# Patient Record
Sex: Female | Born: 1985 | Race: White | Hispanic: No | Marital: Single | Smoking: Former smoker
Health system: Southern US, Community
[De-identification: ages and names within clinical notes are randomized; demographics above are authoritative.]

## PROBLEM LIST (undated history)

## (undated) DIAGNOSIS — K760 Fatty (change of) liver, not elsewhere classified: Secondary | ICD-10-CM

## (undated) DIAGNOSIS — Z8489 Family history of other specified conditions: Secondary | ICD-10-CM

## (undated) DIAGNOSIS — H3090 Unspecified chorioretinal inflammation, unspecified eye: Secondary | ICD-10-CM

## (undated) DIAGNOSIS — Z87442 Personal history of urinary calculi: Secondary | ICD-10-CM

## (undated) HISTORY — DX: Fatty (change of) liver, not elsewhere classified: K76.0

## (undated) HISTORY — PX: WISDOM TOOTH EXTRACTION: SHX21

---
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
## 2007-06-26 ENCOUNTER — Emergency Department (HOSPITAL_COMMUNITY): Admission: EM | Admit: 2007-06-26 | Discharge: 2007-06-27 | Payer: Self-pay | Admitting: Emergency Medicine

## 2007-07-29 ENCOUNTER — Encounter: Admission: RE | Admit: 2007-07-29 | Discharge: 2007-07-29 | Payer: Self-pay | Admitting: Emergency Medicine

## 2011-04-28 LAB — COMPREHENSIVE METABOLIC PANEL
Albumin: 3 — ABNORMAL LOW
Alkaline Phosphatase: 84
BUN: 10
CO2: 25
Chloride: 97
Creatinine, Ser: 0.83
GFR calc non Af Amer: >60
Glucose, Bld: 109 — ABNORMAL HIGH
Potassium: 3.4 — ABNORMAL LOW
Total Bilirubin: 1.1

## 2011-04-28 LAB — CBC
HCT: 40.8
Hemoglobin: 14
MCV: 83.6
WBC: 5.2

## 2011-04-28 LAB — URINALYSIS, ROUTINE W REFLEX MICROSCOPIC
Glucose, UA: NEGATIVE
Ketones, ur: 15 — AB
Nitrite: NEGATIVE
Protein, ur: 30 — AB
Specific Gravity, Urine: 1.018
Urobilinogen, UA: 1
pH: 6

## 2011-04-28 LAB — URINE MICROSCOPIC-ADD ON

## 2011-04-28 LAB — DIFFERENTIAL
Basophils Absolute: 0
Basophils Relative: 0
Monocytes Absolute: 0.6
Neutro Abs: 3.3
Neutrophils Relative %: 63

## 2011-04-28 LAB — PREGNANCY, URINE: Preg Test, Ur: NEGATIVE

## 2011-04-28 LAB — LIPASE, BLOOD: Lipase: 19

## 2015-08-24 ENCOUNTER — Ambulatory Visit (INDEPENDENT_AMBULATORY_CARE_PROVIDER_SITE_OTHER): Payer: 59 | Admitting: Physician Assistant

## 2015-08-24 VITALS — BP 120/80 | HR 75 | Temp 98.3°F | Resp 20 | Ht 64.0 in | Wt 166.4 lb

## 2015-08-24 DIAGNOSIS — J069 Acute upper respiratory infection, unspecified: Secondary | ICD-10-CM | POA: Diagnosis not present

## 2015-08-24 DIAGNOSIS — B9789 Other viral agents as the cause of diseases classified elsewhere: Principal | ICD-10-CM

## 2015-08-24 MED ORDER — GUAIFENESIN ER 1200 MG PO TB12: 1.0000 | ORAL_TABLET | Freq: Two times a day (BID) | ORAL | Status: AC

## 2015-08-24 MED ORDER — HYDROCOD POLST-CPM POLST ER 10-8 MG/5ML PO SUER: 5.0000 mL | Freq: Two times a day (BID) | ORAL | Status: DC | PRN

## 2015-08-24 NOTE — Patient Instructions (Signed)
Please push fluids.  Tylenol and Motrin for fever and body aches.    A humidifier can help especially when the air is dry -if you do not have a humidifier you can boil a pot of water on the stove in your home to help with the dry air.  

## 2015-08-24 NOTE — Progress Notes (Signed)
   Felicia Torres  MRN: [id number] DOB: 1985-10-24  Subjective:  Pt presents to clinic with 3 days h/o cold symptoms that started with subjective fever and myalgias. yesterday she developed sinus congestion but no rhinorrhea.  She has a cough that is disrupting her sleep at night.  Home treatment - tylenol Sick contacts - at work No Flu vaccine  There are no active problems to display for this patient.   No current outpatient prescriptions on file prior to visit.   No current facility-administered medications on file prior to visit.    No Known Allergies  Review of Systems  Constitutional: Positive for fever (subjective) and chills.  HENT: Positive for congestion and postnasal drip. Negative for rhinorrhea and sore throat.   Respiratory: Positive for cough.   Musculoskeletal: Positive for myalgias.  Neurological: Negative for headaches.   Objective:  BP 120/80 mmHg  Pulse 75  Temp(Src) 98.3 F (36.8 C) (Oral)  Resp 20  Ht  (1.626 m)  Wt 166 lb 6.4 oz (75.479 kg)  BMI 28.55 kg/m2  SpO2 98%  LMP 08/12/2015  Physical Exam  Constitutional: She is oriented to person, place, and time and well-developed, well-nourished, and in no distress.  HENT:  Head: Normocephalic and atraumatic.  Right Ear: Hearing, tympanic membrane, external ear and ear canal normal.  Left Ear: Hearing, tympanic membrane, external ear and ear canal normal.  Nose: Mucosal edema (red) present.  Mouth/Throat: Uvula is midline, oropharynx is clear and moist and mucous membranes are normal.  Eyes: Conjunctivae are normal.  Neck: Normal range of motion.  Cardiovascular: Normal rate, regular rhythm and normal heart sounds.   No murmur heard. Pulmonary/Chest: Effort normal and breath sounds normal.  Neurological: She is alert and oriented to person, place, and time. Gait normal.  Skin: Skin is warm and dry.  Psychiatric: Mood, memory, affect and judgment normal.  Vitals reviewed.   Assessment  and Plan :  Viral URI with cough - Plan: Guaifenesin (MUCINEX MAXIMUM STRENGTH) 1200 MG TB12, chlorpheniramine-HYDROcodone (TUSSIONEX PENNKINETIC ER) 10-8 MG/5ML SUER, Care order/instruction  Symptomatic care d/w pt.  Benny Lennert PA-C  [facility] 08/24/2015 9:31 AM

## 2017-10-13 ENCOUNTER — Emergency Department (HOSPITAL_COMMUNITY)
Admission: EM | Admit: 2017-10-13 | Discharge: 2017-10-13 | Disposition: A | Discharge status: Home / Self Care | Payer: Managed Care, Other (non HMO) | Attending: Emergency Medicine | Admitting: Emergency Medicine

## 2017-10-13 ENCOUNTER — Other Ambulatory Visit: Payer: Self-pay

## 2017-10-13 ENCOUNTER — Encounter (HOSPITAL_COMMUNITY): Payer: Self-pay | Admitting: Nurse Practitioner

## 2017-10-13 ENCOUNTER — Emergency Department (HOSPITAL_COMMUNITY): Payer: Managed Care, Other (non HMO)

## 2017-10-13 DIAGNOSIS — N130 Hydronephrosis with ureteropelvic junction obstruction: Secondary | ICD-10-CM | POA: Diagnosis not present

## 2017-10-13 DIAGNOSIS — F17210 Nicotine dependence, cigarettes, uncomplicated: Secondary | ICD-10-CM | POA: Insufficient documentation

## 2017-10-13 DIAGNOSIS — N201 Calculus of ureter: Secondary | ICD-10-CM | POA: Diagnosis not present

## 2017-10-13 DIAGNOSIS — R112 Nausea with vomiting, unspecified: Secondary | ICD-10-CM | POA: Diagnosis not present

## 2017-10-13 DIAGNOSIS — R319 Hematuria, unspecified: Secondary | ICD-10-CM | POA: Diagnosis not present

## 2017-10-13 DIAGNOSIS — R1031 Right lower quadrant pain: Secondary | ICD-10-CM | POA: Diagnosis present

## 2017-10-13 LAB — URINALYSIS, ROUTINE W REFLEX MICROSCOPIC
Bilirubin Urine: NEGATIVE
Glucose, UA: NEGATIVE mg/dL
KETONES UR: 80 mg/dL — AB
Leukocytes, UA: NEGATIVE
Nitrite: NEGATIVE
PROTEIN: 30 mg/dL — AB
Specific Gravity, Urine: 1.029 (ref 1.005–1.030)
pH: 6 (ref 5.0–8.0)

## 2017-10-13 LAB — COMPREHENSIVE METABOLIC PANEL
ALBUMIN: 4 g/dL (ref 3.5–5.0)
ALT: 21 U/L (ref 14–54)
AST: 21 U/L (ref 15–41)
Alkaline Phosphatase: 46 U/L (ref 38–126)
Anion gap: 8 (ref 5–15)
BUN: 14 mg/dL (ref 6–20)
CHLORIDE: 106 mmol/L (ref 101–111)
CO2: 24 mmol/L (ref 22–32)
CREATININE: 0.94 mg/dL (ref 0.44–1.00)
Calcium: 9.1 mg/dL (ref 8.9–10.3)
GFR calc non Af Amer: >60 mL/min (ref >60)
GLUCOSE: 112 mg/dL — AB (ref 65–99)
Potassium: 4.2 mmol/L (ref 3.5–5.1)
SODIUM: 138 mmol/L (ref 135–145)
Total Bilirubin: 0.8 mg/dL (ref 0.3–1.2)
Total Protein: 7.5 g/dL (ref 6.5–8.1)

## 2017-10-13 LAB — CBC
HCT: 41.4 % (ref 36.0–46.0)
Hemoglobin: 13.9 g/dL (ref 12.0–15.0)
MCH: 29.6 pg (ref 26.0–34.0)
MCHC: 33.6 g/dL (ref 30.0–36.0)
MCV: 88.3 fL (ref 78.0–100.0)
PLATELETS: 279 10*3/uL (ref 150–400)
RBC: 4.69 MIL/uL (ref 3.87–5.11)
RDW: 13.3 % (ref 11.5–15.5)
WBC: 13.1 10*3/uL — ABNORMAL HIGH (ref 4.0–10.5)

## 2017-10-13 LAB — I-STAT BETA HCG BLOOD, ED (MC, WL, AP ONLY): I-stat hCG, quantitative: <5 m[IU]/mL (ref <5)

## 2017-10-13 LAB — LIPASE, BLOOD: LIPASE: 28 U/L (ref 11–51)

## 2017-10-13 MED ORDER — HYDROCODONE-ACETAMINOPHEN 5-325 MG PO TABS
2.0000 | ORAL_TABLET | Freq: Once | ORAL | Status: AC
  Administered 2017-10-13: 2 via ORAL
  Filled 2017-10-13: qty 2

## 2017-10-13 MED ORDER — SODIUM CHLORIDE 0.9 % IV BOLUS
1000.0000 mL | Freq: Once | INTRAVENOUS | Status: AC
  Administered 2017-10-13: 1000 mL via INTRAVENOUS

## 2017-10-13 MED ORDER — ONDANSETRON 4 MG PO TBDP: 4.0000 mg | ORAL_TABLET | Freq: Three times a day (TID) | ORAL | 0 refills | Status: DC | PRN

## 2017-10-13 MED ORDER — ONDANSETRON HCL 4 MG/2ML IJ SOLN
4.0000 mg | Freq: Once | INTRAMUSCULAR | Status: AC
  Administered 2017-10-13: 4 mg via INTRAVENOUS
  Filled 2017-10-13: qty 2

## 2017-10-13 MED ORDER — KETOROLAC TROMETHAMINE 30 MG/ML IJ SOLN
30.0000 mg | Freq: Once | INTRAMUSCULAR | Status: AC
  Administered 2017-10-13: 30 mg via INTRAVENOUS
  Filled 2017-10-13: qty 1

## 2017-10-13 MED ORDER — HYDROCODONE-ACETAMINOPHEN 5-325 MG PO TABS: 1.0000 | ORAL_TABLET | ORAL | 0 refills | Status: DC | PRN

## 2017-10-13 NOTE — Discharge Instructions (Signed)
°  Kidney Stone There is evidence of a kidney stone on the right side.  It appears as though it is on its way out.  Some kidney stones can take up to 30 days to pass. Hydration: Hydration is key to helping a kidney stone pass.  Have a goal of half a liter of water every hour or two. Antiinflammatory medications: Take 600 mg of ibuprofen every 6 hours or 440 mg (over the counter dose) to 500 mg (prescription dose) of naproxen every 12 hours for the next 3 days. After this time, these medications may be used as needed for pain. Take these medications with food to avoid upset stomach. Choose only one of these medications, do not take them together. Tylenol: Should you continue to have additional pain while taking the ibuprofen or naproxen, you may add in tylenol as needed. Your daily total maximum amount of tylenol from all sources should be limited to 4000mg /day for persons without liver problems, or 2000mg /day for those with liver problems. Vicodin: May take Vicodin as needed for severe pain.  Do not drive or perform other dangerous activities while taking the Vicodin.  Please note that each pill of Vicodin contains 325 mg of Tylenol and the above dosage limits apply. Zofran: Use of Zofran, as needed, for nausea/vomiting. Follow-up: Follow-up with the urologist as soon as possible on this matter.

## 2017-10-13 NOTE — ED Triage Notes (Signed)
Patient came to the ER for back pain on the right side and blood in her urine. Onset started last night. Vomited one time this AM.

## 2017-10-13 NOTE — ED Provider Notes (Addendum)
[facility]-EMERGENCY DEPT Provider Note   CSN: [id number] Arrival date & time: 10/13/17  [id number]     History   Chief Complaint No chief complaint on file.   HPI Felicia Torres is a 32 y.o. female.  HPI   Felicia Torres is a 32 y.o. female, patient with no pertinent past medical history, presenting to the ED with right lower back pain beginning yesterday. Pain is cramping, ranging from 7/10 to 10/10, radiating to right flank. Accompanied by hematuria this morning.  Accompanied by nausea and vomiting, vomiting with spikes in pain.  LMP 3/7. Has tried ibuprofen, which helped last night, but not today. Denies fever, dysuria, abnormal vaginal discharge/bleeding, diarrhea, abdominal pain, or any other complaints.      History reviewed. No pertinent past medical history.  There are no active problems to display for this patient.   History reviewed. No pertinent surgical history.   OB History   None      Home Medications    Prior to Admission medications   Medication Sig Start Date End Date Taking? Authorizing Provider  ibuprofen (ADVIL,MOTRIN) 200 MG tablet Take 600 mg by mouth daily as needed (PAIN).   Yes [provider]  chlorpheniramine-HYDROcodone (TUSSIONEX PENNKINETIC ER) 10-8 MG/5ML SUER Take 5 mLs by mouth 2 (two) times daily as needed for cough. Patient not taking: Reported on 10/13/2017 08/24/15   Morrell RiddleWeber, Sarah L, PA-C  HYDROcodone-acetaminophen (NORCO/VICODIN) 5-325 MG tablet Take 1-2 tablets by mouth every 4 (four) hours as needed for severe pain. 10/13/17   Joy, Shawn C, PA-C  ondansetron (ZOFRAN ODT) 4 MG disintegrating tablet Take 1 tablet (4 mg total) by mouth every 8 (eight) hours as needed for nausea or vomiting. 10/13/17   Joy, Hillard DankerShawn C, PA-C    Family History Family History  Problem Relation Age of Onset  . Cancer Father     Social History Social History   Tobacco Use  . Smoking status: Current Every Day Smoker   Packs/day: 1.00    Years: 10.00    Pack years: 10.00    Types: Cigarettes  . Smokeless tobacco: Never Used  Substance Use Topics  . Alcohol use: Yes    Alcohol/week: 0.6 oz    Types: 1 Standard drinks or equivalent per week  . Drug use: No     Allergies   Patient has no known allergies.   Review of Systems Review of Systems  Constitutional: Negative for chills and fever.  Respiratory: Negative for cough and shortness of breath.   Gastrointestinal: Positive for nausea and vomiting. Negative for abdominal pain, blood in stool and diarrhea.  Genitourinary: Positive for flank pain and hematuria. Negative for vaginal bleeding and vaginal discharge.  Musculoskeletal: Positive for back pain.  All other systems reviewed and are negative.    Physical Exam Updated Vital Signs BP 126/88   Pulse 76   Temp 97.6 F (36.4 C)   Resp 18   Ht 5\' 4"  (1.626 m)   Wt 80.3 kg (177 lb)   SpO2 94%   BMI 30.38 kg/m   Physical Exam  Constitutional: She appears well-developed and well-nourished. No distress.  HENT:  Head: Normocephalic and atraumatic.  Eyes: Conjunctivae are normal.  Neck: Neck supple.  Cardiovascular: Normal rate, regular rhythm, normal heart sounds and intact distal pulses.  Pulmonary/Chest: Effort normal and breath sounds normal. No respiratory distress.  Abdominal: Soft. There is no tenderness. There is no guarding and no CVA tenderness.  Musculoskeletal: She  exhibits no edema.  Lymphadenopathy:    She has no cervical adenopathy.  Neurological: She is alert.  Skin: Skin is warm and dry. She is not diaphoretic.  Psychiatric: She has a normal mood and affect. Her behavior is normal.  Nursing note and vitals reviewed.    ED Treatments / Results  Labs (all labs ordered are listed, but only abnormal results are displayed) Labs Reviewed  COMPREHENSIVE METABOLIC PANEL - Abnormal; Notable for the following components:      Result Value   Glucose, Bld 112 (*)    All  other components within normal limits  CBC - Abnormal; Notable for the following components:   WBC 13.1 (*)    All other components within normal limits  URINALYSIS, ROUTINE W REFLEX MICROSCOPIC - Abnormal; Notable for the following components:   Color, Urine AMBER (*)    APPearance HAZY (*)    Hgb urine dipstick MODERATE (*)    Ketones, ur 80 (*)    Protein, ur 30 (*)    Bacteria, UA MANY (*)    Squamous Epithelial / LPF TOO NUMEROUS TO COUNT (*)    All other components within normal limits  LIPASE, BLOOD  I-STAT BETA HCG BLOOD, ED ([facility], [facility], [facility] ONLY)    EKG None  Radiology Ct Renal Stone Study  Result Date: 10/13/2017 CLINICAL DATA:  Right-sided flank pain, vomiting, and hematuria. EXAM: CT ABDOMEN AND PELVIS WITHOUT CONTRAST TECHNIQUE: Multidetector CT imaging of the abdomen and pelvis was performed following the standard protocol without IV contrast. COMPARISON:  None. FINDINGS: Lower chest: No acute findings. Hepatobiliary: No mass visualized on this unenhanced exam. Gallbladder is unremarkable. Pancreas: No mass or inflammatory process visualized on this unenhanced exam. Spleen:  Within normal limits in size. Adrenals/Urinary tract: Mild right hydronephrosis is seen due to a 4 mm calculus at the right ureteropelvic junction. A 2 mm calculus is seen in the midpole the left kidney, however there is no evidence of left-sided ureteral calculi or hydronephrosis. Stomach/Bowel: No evidence of obstruction, inflammatory process, or abnormal fluid collections. Normal appendix visualized. Vascular/Lymphatic: No pathologically enlarged lymph nodes identified. No evidence of abdominal aortic aneurysm. Reproductive:  No mass or other significant abnormality. Other:  None. Musculoskeletal:  No suspicious bone lesions identified. IMPRESSION: Mild right hydronephrosis due to 4 mm calculus at the right ureteropelvic junction. Tiny nonobstructing left renal calculus. Electronically Signed   By: Myles Rosenthal  M.D.   On: 10/13/2017 16:19    Procedures Procedures (including critical care time)  Medications Ordered in ED Medications  ondansetron (ZOFRAN) injection 4 mg (4 mg Intravenous Given 10/13/17 1556)  sodium chloride 0.9 % bolus 1,000 mL (0 mLs Intravenous Stopped 10/13/17 1732)  ketorolac (TORADOL) 30 MG/ML injection 30 mg (30 mg Intravenous Given 10/13/17 1556)  HYDROcodone-acetaminophen (NORCO/VICODIN) 5-325 MG per tablet 2 tablet (2 tablets Oral Given 10/13/17 1734)     Initial Impression / Assessment and Plan / ED Course  I have reviewed the triage vital signs and the nursing notes.  Pertinent labs & imaging results that were available during my care of the patient were reviewed by me and considered in my medical decision making (see chart for details).     Patient presents with right lower back and flank pain.  Pain pattern consistent with renal colic.  Ureteral stone at UPJ noted on the right.  Mild leukocytosis present.  Hematuria noted on UA, consistent with stone disease.  Lab results otherwise unremarkable.  Urology follow-up. The patient was  given instructions for home care as well as return precautions. Patient voices understanding of these instructions, accepts the plan, and is comfortable with discharge.    Vitals:   10/13/17 0934 10/13/17 0936 10/13/17 1521 10/13/17 1733  BP: 126/88  (!) 122/92 114/73  Pulse: 76  (!) 59 77  Resp: 18  20 18   Temp: 97.6 F (36.4 C)  97.7 F (36.5 C)   TempSrc:   Oral   SpO2: 94%  98% 100%  Weight:  80.3 kg (177 lb)    Height:  5\' 4"  (1.626 m)       Final Clinical Impressions(s) / ED Diagnoses   Final diagnoses:  Right ureteral stone    ED Discharge Orders        Ordered    HYDROcodone-acetaminophen (NORCO/VICODIN) 5-325 MG tablet  Every 4 hours PRN     10/13/17 1635    ondansetron (ZOFRAN ODT) 4 MG disintegrating tablet  Every 8 hours PRN     10/13/17 1635       Anselm Pancoast, PA-C 10/13/17 1639    Anselm Pancoast,  PA-C 10/13/17 1913    Loren Racer, MD 10/20/17 [phone]

## 2017-10-15 ENCOUNTER — Ambulatory Visit (HOSPITAL_BASED_OUTPATIENT_CLINIC_OR_DEPARTMENT_OTHER): Payer: Managed Care, Other (non HMO) | Admitting: Anesthesiology

## 2017-10-15 ENCOUNTER — Encounter (HOSPITAL_BASED_OUTPATIENT_CLINIC_OR_DEPARTMENT_OTHER)
Admission: RE | Disposition: A | Discharge status: Home / Self Care | Payer: Self-pay | Source: Ambulatory Visit | Attending: Urology

## 2017-10-15 ENCOUNTER — Encounter (HOSPITAL_BASED_OUTPATIENT_CLINIC_OR_DEPARTMENT_OTHER): Payer: Self-pay

## 2017-10-15 ENCOUNTER — Ambulatory Visit (HOSPITAL_BASED_OUTPATIENT_CLINIC_OR_DEPARTMENT_OTHER)
Admission: RE | Admit: 2017-10-15 | Discharge: 2017-10-15 | Disposition: A | Discharge status: Home / Self Care | Payer: Managed Care, Other (non HMO) | Source: Ambulatory Visit | Attending: Urology | Admitting: Urology

## 2017-10-15 ENCOUNTER — Other Ambulatory Visit: Payer: Self-pay | Admitting: Urology

## 2017-10-15 ENCOUNTER — Other Ambulatory Visit: Payer: Self-pay

## 2017-10-15 DIAGNOSIS — N201 Calculus of ureter: Secondary | ICD-10-CM | POA: Diagnosis not present

## 2017-10-15 DIAGNOSIS — R509 Fever, unspecified: Secondary | ICD-10-CM | POA: Insufficient documentation

## 2017-10-15 HISTORY — PX: URETEROSCOPY: SHX842

## 2017-10-15 HISTORY — DX: Family history of other specified conditions: Z84.89

## 2017-10-15 HISTORY — DX: Personal history of urinary calculi: Z87.442

## 2017-10-15 HISTORY — DX: Unspecified chorioretinal inflammation, unspecified eye: H30.90

## 2017-10-15 LAB — POCT PREGNANCY, URINE: Preg Test, Ur: NEGATIVE

## 2017-10-15 SURGERY — URETEROSCOPY
Anesthesia: General | Laterality: Right

## 2017-10-15 MED ORDER — FENTANYL CITRATE (PF) 100 MCG/2ML IJ SOLN
INTRAMUSCULAR | Status: DC | PRN
  Administered 2017-10-15: 50 ug via INTRAVENOUS

## 2017-10-15 MED ORDER — FENTANYL CITRATE (PF) 100 MCG/2ML IJ SOLN
25.0000 ug | INTRAMUSCULAR | Status: DC | PRN
  Filled 2017-10-15: qty 1

## 2017-10-15 MED ORDER — CIPROFLOXACIN HCL 500 MG PO TABS: 500.0000 mg | ORAL_TABLET | Freq: Two times a day (BID) | ORAL | 0 refills | Status: DC

## 2017-10-15 MED ORDER — LIDOCAINE 2% (20 MG/ML) 5 ML SYRINGE
INTRAMUSCULAR | Status: DC | PRN
  Administered 2017-10-15: 100 mg via INTRAVENOUS

## 2017-10-15 MED ORDER — MIDAZOLAM HCL 2 MG/2ML IJ SOLN
INTRAMUSCULAR | Status: AC
  Filled 2017-10-15: qty 2

## 2017-10-15 MED ORDER — IOHEXOL 300 MG/ML  SOLN
INTRAMUSCULAR | Status: DC | PRN
Start: 2017-10-15 — End: 2017-10-15
  Administered 2017-10-15: 4 mL

## 2017-10-15 MED ORDER — CIPROFLOXACIN IN D5W 400 MG/200ML IV SOLN
INTRAVENOUS | Status: AC
  Filled 2017-10-15: qty 200

## 2017-10-15 MED ORDER — FENTANYL CITRATE (PF) 100 MCG/2ML IJ SOLN
INTRAMUSCULAR | Status: AC
  Filled 2017-10-15: qty 2

## 2017-10-15 MED ORDER — PHENAZOPYRIDINE HCL 200 MG PO TABS: 200.0000 mg | ORAL_TABLET | Freq: Three times a day (TID) | ORAL | 0 refills | Status: DC | PRN

## 2017-10-15 MED ORDER — MEPERIDINE HCL 25 MG/ML IJ SOLN
6.2500 mg | INTRAMUSCULAR | Status: DC | PRN
  Filled 2017-10-15: qty 1

## 2017-10-15 MED ORDER — ONDANSETRON HCL 4 MG/2ML IJ SOLN
INTRAMUSCULAR | Status: DC | PRN
  Administered 2017-10-15: 4 mg via INTRAVENOUS

## 2017-10-15 MED ORDER — CIPROFLOXACIN IN D5W 400 MG/200ML IV SOLN
400.0000 mg | INTRAVENOUS | Status: AC
  Administered 2017-10-15: 400 mg via INTRAVENOUS
  Filled 2017-10-15: qty 200

## 2017-10-15 MED ORDER — BELLADONNA ALKALOIDS-OPIUM 16.2-60 MG RE SUPP
RECTAL | Status: AC
  Filled 2017-10-15: qty 1

## 2017-10-15 MED ORDER — LACTATED RINGERS IV SOLN
INTRAVENOUS | Status: DC
  Administered 2017-10-15: 12:00:00 via INTRAVENOUS
  Filled 2017-10-15: qty 1000

## 2017-10-15 MED ORDER — DEXAMETHASONE SODIUM PHOSPHATE 4 MG/ML IJ SOLN
INTRAMUSCULAR | Status: DC | PRN
  Administered 2017-10-15: 4 mg via INTRAVENOUS

## 2017-10-15 MED ORDER — SODIUM CHLORIDE 0.9 % IR SOLN
Status: DC | PRN
  Administered 2017-10-15: 3000 mL

## 2017-10-15 MED ORDER — METOCLOPRAMIDE HCL 5 MG/ML IJ SOLN
INTRAMUSCULAR | Status: AC
Start: 2017-10-15 — End: 2017-10-15
  Filled 2017-10-15: qty 2

## 2017-10-15 MED ORDER — BELLADONNA ALKALOIDS-OPIUM 16.2-60 MG RE SUPP
RECTAL | Status: DC | PRN
  Administered 2017-10-15: 1 via RECTAL

## 2017-10-15 MED ORDER — ACETAMINOPHEN 160 MG/5ML PO SOLN
325.0000 mg | ORAL | Status: DC | PRN
  Filled 2017-10-15: qty 20.3

## 2017-10-15 MED ORDER — METOCLOPRAMIDE HCL 5 MG/ML IJ SOLN
INTRAMUSCULAR | Status: DC | PRN
  Administered 2017-10-15: 10 mg via INTRAVENOUS

## 2017-10-15 MED ORDER — ONDANSETRON HCL 4 MG/2ML IJ SOLN
4.0000 mg | Freq: Once | INTRAMUSCULAR | Status: DC | PRN
  Filled 2017-10-15: qty 2

## 2017-10-15 MED ORDER — ACETAMINOPHEN 325 MG PO TABS
325.0000 mg | ORAL_TABLET | ORAL | Status: DC | PRN
  Filled 2017-10-15: qty 2

## 2017-10-15 MED ORDER — LIDOCAINE 2% (20 MG/ML) 5 ML SYRINGE
INTRAMUSCULAR | Status: AC
  Filled 2017-10-15: qty 5

## 2017-10-15 MED ORDER — DEXAMETHASONE SODIUM PHOSPHATE 10 MG/ML IJ SOLN
INTRAMUSCULAR | Status: AC
  Filled 2017-10-15: qty 1

## 2017-10-15 MED ORDER — OXYCODONE HCL 5 MG/5ML PO SOLN
5.0000 mg | Freq: Once | ORAL | Status: DC | PRN
  Filled 2017-10-15: qty 5

## 2017-10-15 MED ORDER — MIDAZOLAM HCL 5 MG/5ML IJ SOLN
INTRAMUSCULAR | Status: DC | PRN
  Administered 2017-10-15: 2 mg via INTRAVENOUS

## 2017-10-15 MED ORDER — PROPOFOL 10 MG/ML IV BOLUS
INTRAVENOUS | Status: DC | PRN
  Administered 2017-10-15: 200 mg via INTRAVENOUS

## 2017-10-15 MED ORDER — CIPROFLOXACIN HCL 500 MG PO TABS: 500.0000 mg | ORAL_TABLET | Freq: Once | ORAL | 0 refills | Status: DC

## 2017-10-15 MED ORDER — OXYCODONE HCL 5 MG PO TABS
5.0000 mg | ORAL_TABLET | Freq: Once | ORAL | Status: DC | PRN
  Filled 2017-10-15: qty 1

## 2017-10-15 MED ORDER — ONDANSETRON HCL 4 MG/2ML IJ SOLN
INTRAMUSCULAR | Status: AC
  Filled 2017-10-15: qty 2

## 2017-10-15 MED ORDER — PROPOFOL 10 MG/ML IV BOLUS
INTRAVENOUS | Status: AC
  Filled 2017-10-15: qty 20

## 2017-10-15 SURGICAL SUPPLY — 32 items
BAG DRAIN URO-CYSTO SKYTR STRL (DRAIN) ×3 IMPLANT
BASKET LASER NITINOL 1.9FR (BASKET) IMPLANT
BASKET STNLS GEMINI 4WIRE 3FR (BASKET) IMPLANT
BASKET ZERO TIP NITINOL 2.4FR (BASKET) IMPLANT
CATH URET 5FR 28IN OPEN ENDED (CATHETERS) ×3 IMPLANT
CATH URET DUAL LUMEN 6-10FR 50 (CATHETERS) IMPLANT
CLOTH BEACON ORANGE TIMEOUT ST (SAFETY) ×3 IMPLANT
EXTRACTOR STONE 1.7FRX115CM (UROLOGICAL SUPPLIES) IMPLANT
FIBER LASER TRAC TIP (UROLOGICAL SUPPLIES) IMPLANT
GLOVE BIO SURGEON STRL SZ7.5 (GLOVE) ×3 IMPLANT
GLOVE BIOGEL PI IND STRL 7.5 (GLOVE) ×2 IMPLANT
GLOVE BIOGEL PI INDICATOR 7.5 (GLOVE) ×4
GLOVE SURG SS PI 7.0 STRL IVOR (GLOVE) ×3 IMPLANT
GOWN STRL REUS W/TWL LRG LVL3 (GOWN DISPOSABLE) ×3 IMPLANT
GOWN STRL REUS W/TWL XL LVL3 (GOWN DISPOSABLE) ×9 IMPLANT
GUIDEWIRE 0.038 PTFE COATED (WIRE) IMPLANT
GUIDEWIRE ANG ZIPWIRE 038X150 (WIRE) IMPLANT
GUIDEWIRE STR DUAL SENSOR (WIRE) ×3 IMPLANT
INFUSOR MANOMETER BAG 3000ML (MISCELLANEOUS) ×3 IMPLANT
IV NS IRRIG 3000ML ARTHROMATIC (IV SOLUTION) ×3 IMPLANT
KIT BALLIN UROMAX 15FX10 (LABEL) IMPLANT
KIT BALLN UROMAX 15FX4 (MISCELLANEOUS) IMPLANT
KIT BALLN UROMAX 26 75X4 (MISCELLANEOUS)
KIT TURNOVER CYSTO (KITS) ×3 IMPLANT
MANIFOLD NEPTUNE II (INSTRUMENTS) ×3 IMPLANT
NS IRRIG 500ML POUR BTL (IV SOLUTION) ×3 IMPLANT
PACK CYSTO (CUSTOM PROCEDURE TRAY) ×3 IMPLANT
SET HIGH PRES BAL DIL (LABEL)
SHEATH ACCESS URETERAL 38CM (SHEATH) IMPLANT
STENT URET 6FRX24 CONTOUR (STENTS) ×3 IMPLANT
TUBE CONNECTING 12'X1/4 (SUCTIONS)
TUBE CONNECTING 12X1/4 (SUCTIONS) IMPLANT

## 2017-10-15 NOTE — Anesthesia Preprocedure Evaluation (Signed)
Anesthesia Evaluation  Patient identified by MRN, date of birth, ID band Patient awake    Reviewed: Allergy & Precautions, NPO status , Patient's Chart, lab work & pertinent test results  Airway Mallampati: II  TM Distance: >3 FB Neck ROM: Full    Dental no notable dental hx.    Pulmonary neg pulmonary ROS, Current Smoker,    Pulmonary exam normal breath sounds clear to auscultation       Cardiovascular negative cardio ROS Normal cardiovascular exam Rhythm:Regular Rate:Normal     Neuro/Psych negative neurological ROS  negative psych ROS   GI/Hepatic negative GI ROS, Neg liver ROS,   Endo/Other  negative endocrine ROS  Renal/GU negative Renal ROS  negative genitourinary   Musculoskeletal negative musculoskeletal ROS (+)   Abdominal   Peds negative pediatric ROS (+)  Hematology negative hematology ROS (+)   Anesthesia Other Findings   Reproductive/Obstetrics negative OB ROS                             Anesthesia Physical Anesthesia Plan  ASA: II  Anesthesia Plan: General   Post-op Pain Management:    Induction: Intravenous  PONV Risk Score and Plan:   Airway Management Planned: LMA  Additional Equipment:   Intra-op Plan:   Post-operative Plan: Extubation in OR  Informed Consent:   Plan Discussed with: CRNA, Surgeon and Anesthesiologist  Anesthesia Plan Comments: ( )        Anesthesia Quick Evaluation

## 2017-10-15 NOTE — Anesthesia Postprocedure Evaluation (Signed)
Anesthesia Post Note  Patient: Felicia Torres  Procedure(s) Performed: cystoscopy, right retrograde, right stent placement (Right )     Patient location during evaluation: PACU Anesthesia Type: General Level of consciousness: awake and alert Pain management: pain level controlled Vital Signs Assessment: post-procedure vital signs reviewed and stable Respiratory status: spontaneous breathing, nonlabored ventilation, respiratory function stable and patient connected to nasal cannula oxygen Cardiovascular status: blood pressure returned to baseline and stable Postop Assessment: no apparent nausea or vomiting Anesthetic complications: no    Last Vitals:  Vitals:   10/15/17 1324 10/15/17 1330  BP:  93/61  Pulse: 99 96  Resp: 16 13  Temp: 37.9 C   SpO2: 92% 94%    Last Pain:  Vitals:   10/15/17 1300  TempSrc:   PainSc: 0-No pain                 Schylar Wuebker

## 2017-10-15 NOTE — Op Note (Signed)
Preoperative diagnosis:  1. Right obstructing stone 2. fever   Postoperative diagnosis:  1. same   Procedure:  1. Cystoscopy 2. right ureteral stent placement 3. right retrograde pyelography with interpretation   Surgeon: Crist FatBenjamin W. Marianny Goris, MD  Anesthesia: General  Complications: None  Intraoperative findings:  Purulent efflux from right UO once obstruction relieved.  Right retrograde pyelography demonstrated a filling defect within the right ureter consistent with the patient's known calculus without other abnormalities.  EBL: Minimal  Specimens: Urine culture  Indication: Felicia Torres is a 32 y.o. patient with right ureteral stone and fever. After reviewing the management options for treatment, he elected to proceed with the above surgical procedure(s). We have discussed the potential benefits and risks of the procedure, side effects of the proposed treatment, the likelihood of the patient achieving the goals of the procedure, and any potential problems that might occur during the procedure or recuperation. Informed consent has been obtained.  Description of procedure:  The patient was taken to the operating room and general anesthesia was induced.  The patient was placed in the dorsal lithotomy position, prepped and draped in the usual sterile fashion, and preoperative antibiotics were administered. A preoperative time-out was performed.   Cystourethroscopy was performed.  The patient's urethra was examined and was normal. The bladder was then systematically examined in its entirety. There was no evidence for any bladder tumors, stones, or other mucosal pathology.    Attention then turned to the rightureteral orifice and a ureteral catheter was used to intubate the ureteral orifice.  Omnipaque contrast was injected through the ureteral catheter and a retrograde pyelogram was performed with findings as dictated above.  A 0.38 sensor guidewire was then advanced up the right  ureter into the renal pelvis under fluoroscopic guidance.  The wire was then backloaded through the cystoscope and a ureteral stent was advance over the wire using Seldinger technique.  The stent was positioned appropriately under fluoroscopic and cystoscopic guidance.  The wire was then removed with an adequate stent curl noted in the renal pelvis as well as in the bladder.  The bladder was then emptied and the procedure ended.  The patient appeared to tolerate the procedure well and without complications.  The patient was able to be awakened and transferred to the recovery unit in satisfactory condition.    Crist FatBenjamin W. Rosangelica Pevehouse, M.D.

## 2017-10-15 NOTE — Discharge Instructions (Signed)
°  Post Anesthesia Home Care Instructions  Activity: Get plenty of rest for the remainder of the day. A responsible individual must stay with you for 24 hours following the procedure.  For the next 24 hours, DO NOT: -Drive a car -Advertising copywriterperate machinery -Drink alcoholic beverages -Take any medication unless instructed by your physician -Make any legal decisions or sign important papers.  Meals: Start with liquid foods such as gelatin or soup. Progress to regular foods as tolerated. Avoid greasy, spicy, heavy foods. If nausea and/or vomiting occur, drink only clear liquids until the nausea and/or vomiting subsides. Call your physician if vomiting continues.  Special Instructions/Symptoms: Your throat may feel dry or sore from the anesthesia or the breathing tube placed in your throat during surgery. If this causes discomfort, gargle with warm salt water. The discomfort should disappear within 24 hours.  If you had a scopolamine patch placed behind your ear for the management of post- operative nausea and/or vomiting:  1. The medication in the patch is effective for 72 hours, after which it should be removed.  Wrap patch in a tissue and discard in the trash. Wash hands thoroughly with soap and water. 2. You may remove the patch earlier than 72 hours if you experience unpleasant side effects which may include dry mouth, dizziness or visual disturbances. 3. Avoid touching the patch. Wash your hands with soap and water after contact with the patch.   DISCHARGE INSTRUCTIONS FOR KIDNEY STONE/URETERAL STENT   MEDICATIONS:  1.  Resume all your other meds from home - except do not take any extra narcotic pain meds that you may have at home.  2. Pyridium is to help with the burning/stinging when you urinate. 3. Vicodin is for moderate/severe pain, otherwise taking upto 1000 mg every 6 hours of plainTylenol will help treat your pain.   4. Take Cipro as prescribed.  ACTIVITY:  1. No driving while on  narcotic pain medications  2. Drink plenty of water  3. Continue to walk at home - you can still get blood clots when you are at home, so keep active, but don't over do it.  4. May return to work/school tomorrow or when you feel ready   BATHING:  1. You can shower and we recommend daily showers  2. You have a string coming from your urethra: The stent string is attached to your ureteral stent. Do not pull on this.   SIGNS/SYMPTOMS TO CALL:  Please call us if you have a fever greater than 101.5, uncontrolled nausea/vomiting, uncontrolled pain, dizziness, unable to urinate, bloody urine, chest pain, shortness of breath, leg swelling, leg pain, redness around wound, drainage from wound, or any other concerns or questions.   You can reach us at [phone].   FOLLOW-UP:  1. You will be scheduled for follow-up surgery in 14 days.

## 2017-10-15 NOTE — Anesthesia Procedure Notes (Signed)
Procedure Name: LMA Insertion Date/Time: 10/15/2017 12:18 PM Performed by: Bethena Midgetddono, Ernest, MD Pre-anesthesia Checklist: Patient identified, Emergency Drugs available, Suction available and Patient being monitored Patient Re-evaluated:Patient Re-evaluated prior to induction Oxygen Delivery Method: Circle system utilized Preoxygenation: Pre-oxygenation with 100% oxygen Induction Type: IV induction Ventilation: Mask ventilation without difficulty LMA: LMA inserted LMA Size: 4.0 Number of attempts: 1 Airway Equipment and Method: Bite block Placement Confirmation: positive ETCO2 Tube secured with: Tape Dental Injury: Teeth and Oropharynx as per pre-operative assessment

## 2017-10-15 NOTE — Transfer of Care (Signed)
Last Vitals:  Vitals Value Taken Time  BP 92/48 10/15/2017  1:00 PM  Temp 36.9 C 10/15/2017 12:52 PM  Pulse 90 10/15/2017  1:00 PM  Resp 15 10/15/2017  1:01 PM  SpO2 98 % 10/15/2017  1:00 PM  Vitals shown include unvalidated device data.  Last Pain:  Vitals:   10/15/17 1149  TempSrc:   PainSc: 5         Immediate Anesthesia Transfer of Care Note  Patient: Felicia Torres  Procedure(s) Performed: Procedure(s) (LRB): cystoscopy, right retrograde, right stent placement (Right)  Patient Location: PACU  Anesthesia Type: General  Level of Consciousness: awake, alert  and oriented  Airway & Oxygen Therapy: Patient Spontanous Breathing and Patient connected to face mask oxygen  Post-op Assessment: Report given to PACU RN and Post -op Vital signs reviewed and stable  Post vital signs: Reviewed and stable  Complications: No apparent anesthesia complications

## 2017-10-15 NOTE — H&P (Signed)
Acute Kidney Stone  HPI: Felicia Torres is a 32 year-old female patient who is here for further eval and management of kidney stones.  She was diagnosed with a kidney stone on approximately 10/13/2017. The patient presented to [facility] with symptoms of a kidney stone.   Her pain started about approximately 10/12/2017. The pain is on the right side.   Abdomen/Pelvic CT: 3/26: 4mm right proximal ureteral stone. The patient underwent No imaging prior to today's appointment.   The patient relates initially having nausea and vomiting. She is currently having flank pain, groin pain, nausea, vomiting, and chills. She denies having back pain, fever, and voiding symptoms. She has been taking percocet. She has not caught a stone in her urine strainer since her symptoms began.   She has never had surgical treatment for calculi in the past. This is her first kidney stone.   10/15/17 R side Flank pain, vomiting and nausea with chills. unable to keep food or liquids down since Tuesday. 4mm Kidney stone on R side.      ALLERGIES: None   MEDICATIONS: Vicodin     GU PSH: None   NON-GU PSH: None   GU PMH: None   NON-GU PMH: None   FAMILY HISTORY: Kidney Cancer - Father Kidney Stones - Sister   SOCIAL HISTORY: Marital Status: Single Preferred Language: English; Race: White Current Smoking Status: Patient has never smoked.   Tobacco Use Assessment Completed: Used Tobacco in last 30 days? Does drink.  Drinks 1 caffeinated drink per day. Has not had a blood transfusion.    REVIEW OF SYSTEMS:    GU Review Female:   Patient reports frequent urination, hard to postpone urination, and burning /pain with urination. Patient denies get up at night to urinate, leakage of urine, stream starts and stops, trouble starting your stream, have to strain to urinate, and being pregnant.  Gastrointestinal (Upper):   Patient reports vomiting and nausea. Patient denies indigestion/ heartburn.  Gastrointestinal (Lower):    Patient reports constipation. Patient denies diarrhea.  Constitutional:   Patient denies fever, night sweats, weight loss, and fatigue.  Skin:   Patient denies skin rash/ lesion and itching.  Eyes:   Patient denies blurred vision and double vision.  Ears/ Nose/ Throat:   Patient denies sore throat and sinus problems.  Hematologic/Lymphatic:   Patient denies swollen glands and easy bruising.  Cardiovascular:   Patient denies leg swelling and chest pains.  Respiratory:   Patient denies cough and shortness of breath.  Endocrine:   Patient denies excessive thirst.  Musculoskeletal:   Patient reports back pain. Patient denies joint pain.  Neurological:   Patient denies headaches and dizziness.  Psychologic:   Patient denies depression and anxiety.   Notes: blood in urine    VITAL SIGNS:      10/15/2017 10:31 AM  Weight 195 lb / 88.45 kg  Height 64 in / 162.56 cm  BP 158/94 mmHg  Heart Rate 128 /min  Temperature 101.6 F / 38.6 C  BMI 33.5 kg/m   MULTI-SYSTEM PHYSICAL EXAMINATION:    Constitutional: Well-nourished. No physical deformities. Normally developed. Good grooming.  Neck: Neck symmetrical, not swollen. Normal tracheal position.  Respiratory: No labored breathing, no use of accessory muscles. Normal breath sounds.  Cardiovascular: Regular rate and rhythm. No murmur, no gallop. Normal temperature, normal extremity pulses, no swelling, no varicosities.  Lymphatic: No enlargement of neck, axillae, groin.  Skin: No paleness, no jaundice, no cyanosis. No lesion, no ulcer, no rash.  Neurologic / Psychiatric: Oriented to time, oriented to place, oriented to person. No depression, no anxiety, no agitation.  Gastrointestinal: No mass, no tenderness, no rigidity, non obese abdomen.  Eyes: Normal conjunctivae. Normal eyelids.  Ears, Nose, Mouth, and Throat: Left ear no scars, no lesions, no masses. Right ear no scars, no lesions, no masses. Nose no scars, no lesions, no masses. Normal  hearing. Normal lips.  Musculoskeletal: Normal gait and station of head and neck.     PAST DATA REVIEWED:  Source Of History:  Patient  Records Review:   Previous Hospital Records, Previous Patient Records  X-Ray Review: C.T. Abdomen/Pelvis: Reviewed Films. Discussed With Patient.     PROCEDURES:          Urinalysis Dipstick Dipstick Cont'd Micro  Color: Yellow Bilirubin: Neg WBC/hpf: 0 - 5/hpf  Appearance: Clear Ketones: 1+ RBC/hpf: 0 - 2/hpf  Specific Gravity: 1.030 Blood: Neg Bacteria: NS (Not Seen)  pH: 5.5 Protein: 1+ Cystals: NS (Not Seen)  Glucose: Neg Urobilinogen: 1.0 Casts: NS (Not Seen)    Nitrites: Neg Trichomonas: Not Present    Leukocyte Esterase: Neg Mucous: Not Present      Epithelial Cells: 0 - 5/hpf      Yeast: NS (Not Seen)      Sperm: Not Present    Notes: MICROSCOPIC PERFORMED ON UNCONCENTRATED URINE          Ketoralac 60mg  - [id number], [id number] Qty: 60 Adm. By: Carla DrapeNichole Miles  Unit: mg Lot No [id number]  Route: IM Exp. Date 04/21/2019  Freq: None Mfgr.:   Site: Left Buttock         Phenergan 25mg  - [id number], [id number] Qty: 25 Adm. By: Carla DrapeNichole Miles  Unit: mg Lot No [id number]  Route: IM Exp. Date 08/21/2018  Freq: None Mfgr.:   Site: Right Buttock   ASSESSMENT:      ICD-10 Details  1 GU:   Ureteral calculus - N20.1    PLAN:           Document Letter(s):  Created for Patient: Clinical Summary    I went over the treatment options for their stone. We discussed ongoing medical expulsion therapy, ESWL and ureteroscopy. Ultimately the patient and I agreed that ureterscopy is the best option. I went over this surgery with the patient in detail. The patient understands after being put to sleep, we would proceed with a telescope to access the stone and potentially use a laser to fragment the stone before removing it with a basket. After removing the stone the patient will require temporary stent placement in the ureter. This is an outpatient procedure. I also discussed  the potential of not being able to gain access safely into the ureter/kidney. This would require that a stent be placed and then the patient rescheduled several weeks later for a second attempt. They also understand the small risks of ureteral trauma causing a stricture or permanent damage. I also explained the risk of urinary tract infection. Having gone over the procedure itself, the expected outcome, and the risks/benefits the patient has agreed to proceed.

## 2017-10-16 ENCOUNTER — Encounter (HOSPITAL_BASED_OUTPATIENT_CLINIC_OR_DEPARTMENT_OTHER): Payer: Self-pay | Admitting: Urology

## 2017-10-17 LAB — URINE CULTURE

## 2019-08-19 IMAGING — CT CT RENAL STONE PROTOCOL
2 of 4 series · 17 of 46 positions shown, 19 images · non-contrast
Comparison: None.

CLINICAL DATA: Right-sided flank pain, vomiting, and hematuria.

EXAM:
CT ABDOMEN AND PELVIS WITHOUT CONTRAST
TECHNIQUE: Multidetector CT imaging of the abdomen and pelvis was performed
following the standard protocol without IV contrast.

[Series 2: axial st · axial · 0.75mm/px · z∈[-515,-90]mm · 14 of 95 slices shown, 16 images]
[im 5/95  soft-tissue]
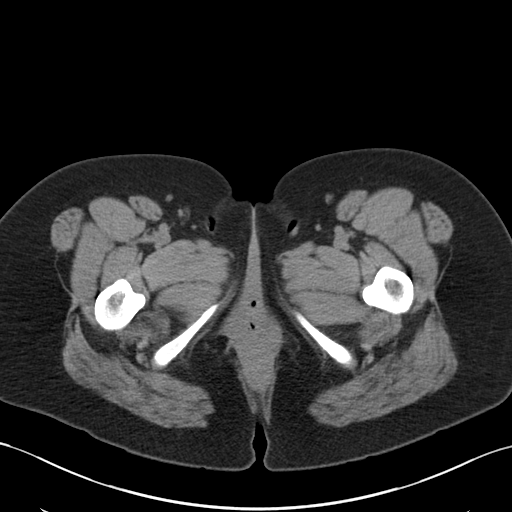
[im 5/95  bone]
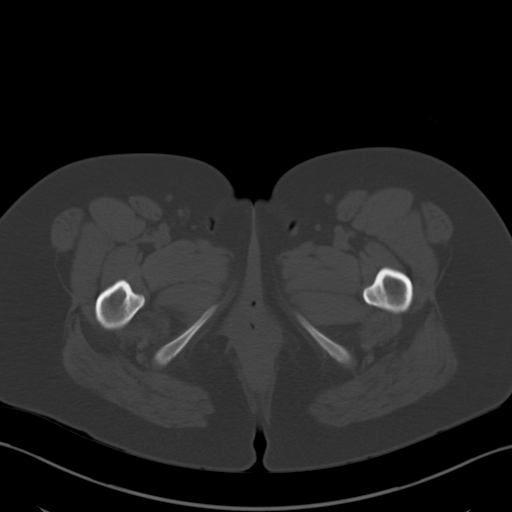
[im 15/95  soft-tissue]
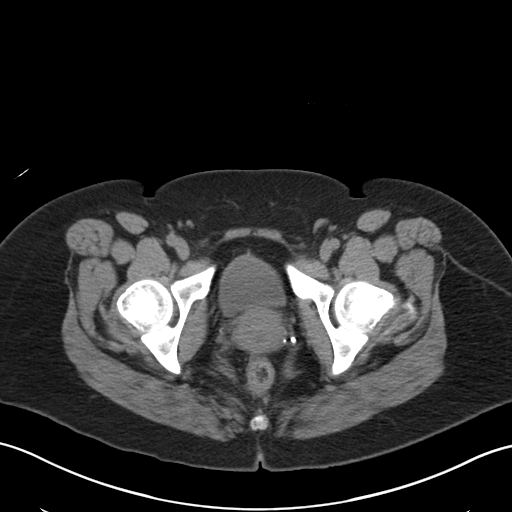
[im 19/95  soft-tissue]
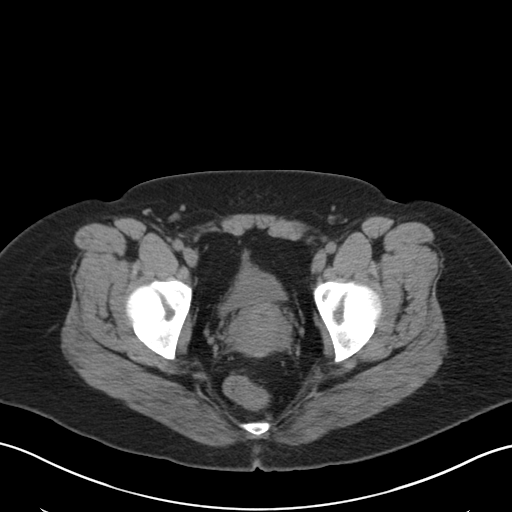
[im 24/95  soft-tissue]
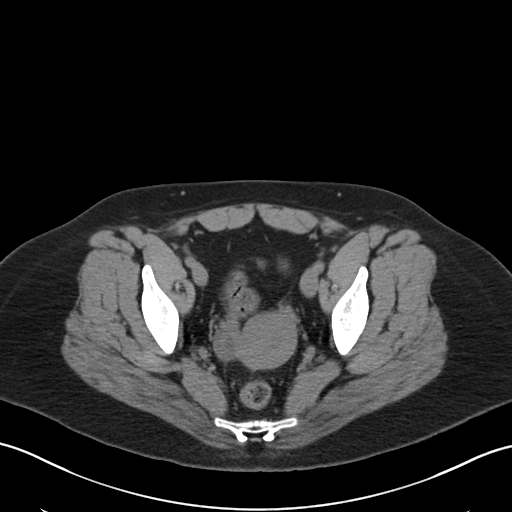
[im 33/95  soft-tissue]
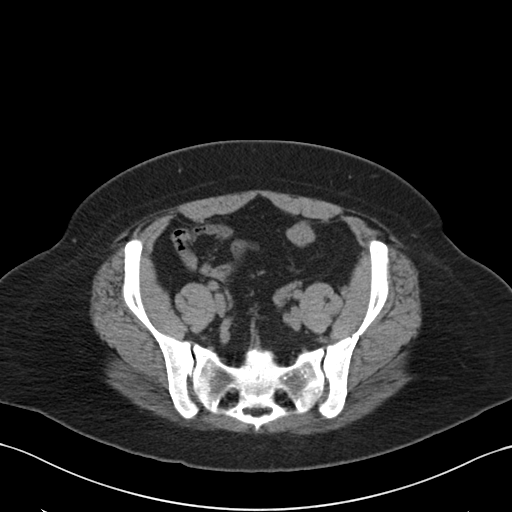
[im 38/95  soft-tissue]
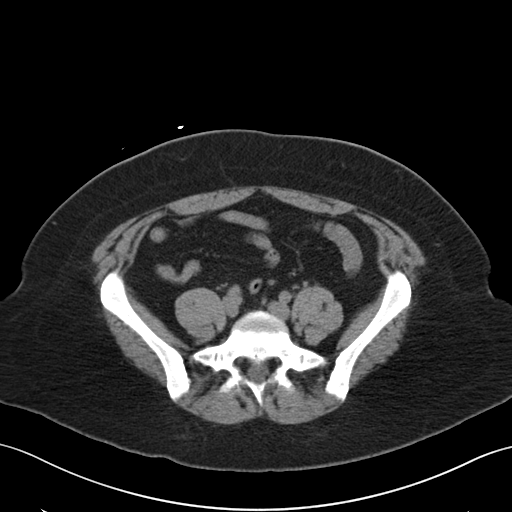
[im 43/95  soft-tissue]
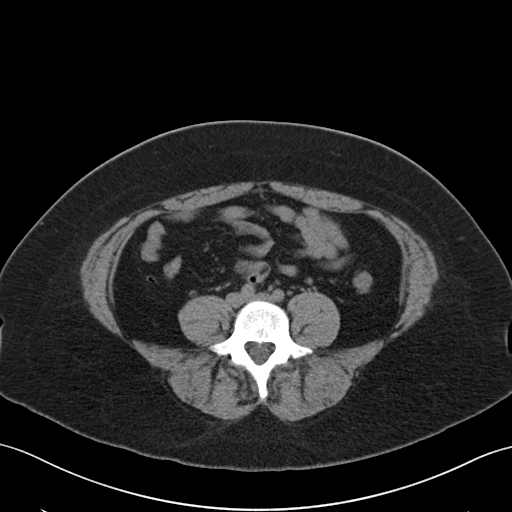
[im 52/95  soft-tissue]
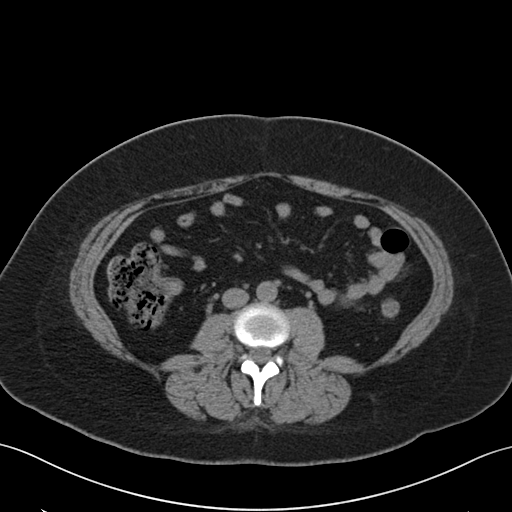
[im 57/95  soft-tissue]
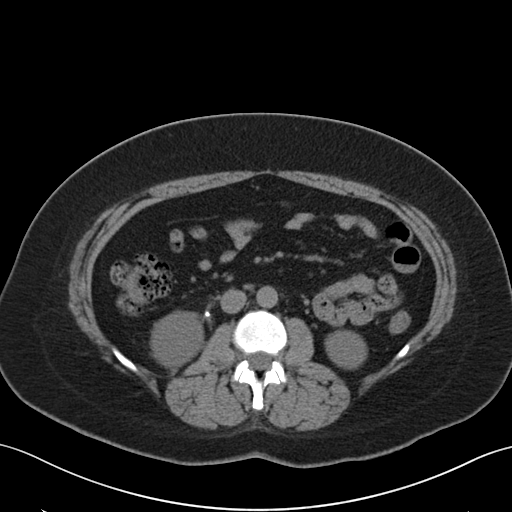
[im 57/95  bone]
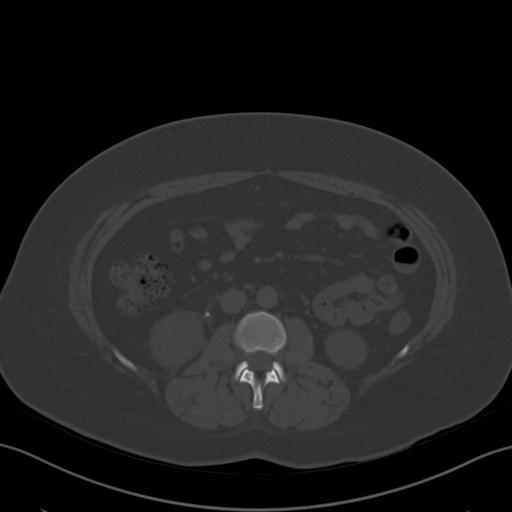
[im 62/95  soft-tissue]
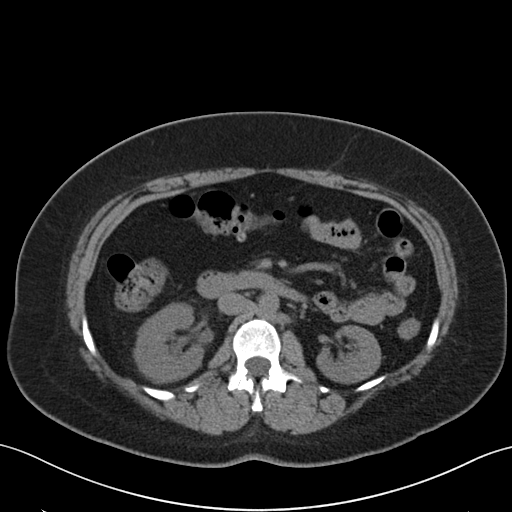
[im 71/95  soft-tissue]
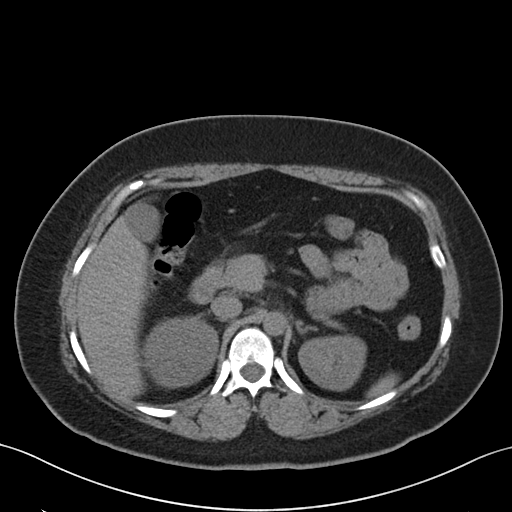
[im 76/95  soft-tissue]
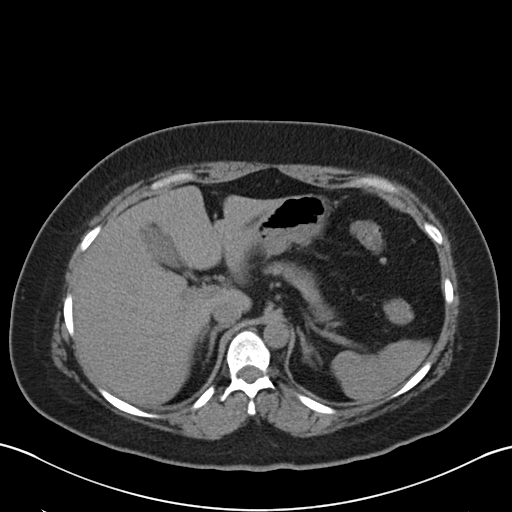
[im 80/95  soft-tissue]
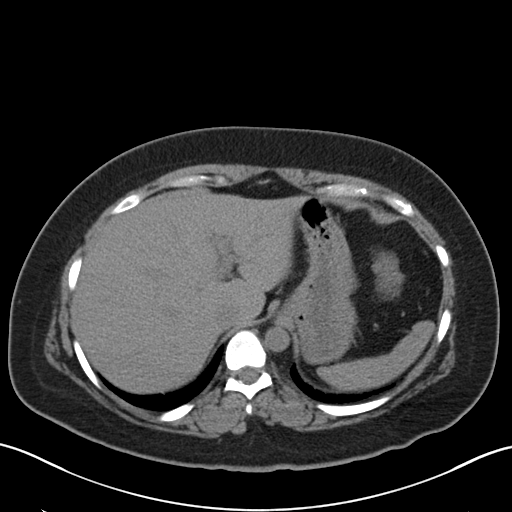
[im 90/95  soft-tissue]
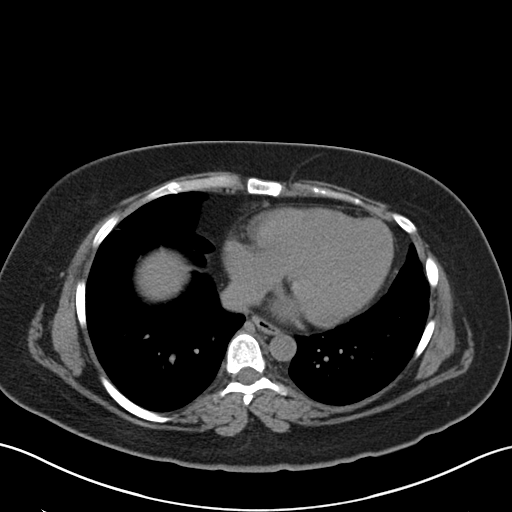

[Series 4: coronal · coronal · 0.79mm/px · 3 of 150 slices shown]
[im 50/150  soft-tissue]
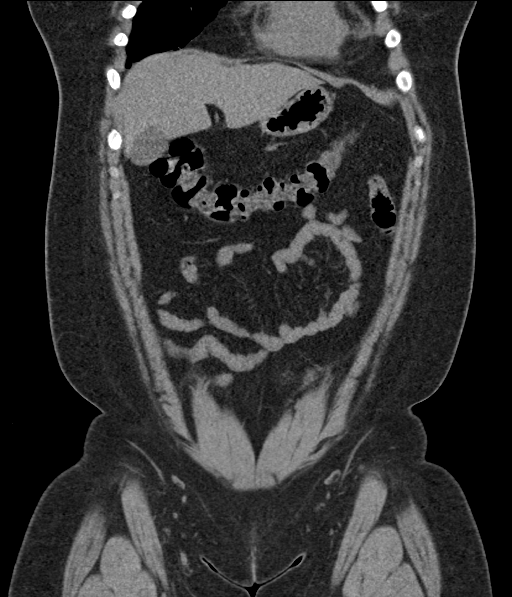
[im 67/150  soft-tissue]
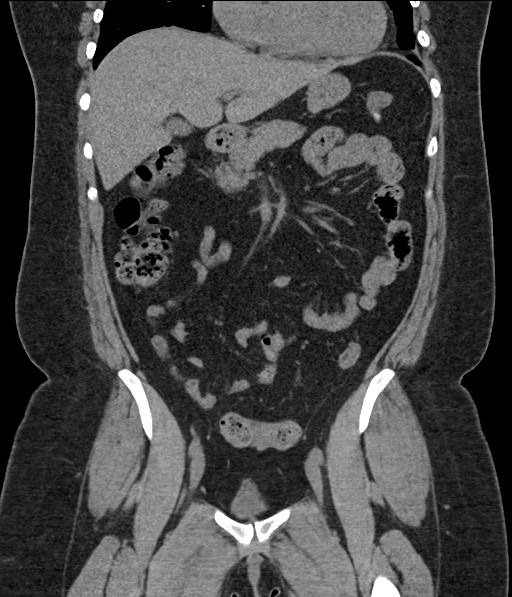
[im 83/150  soft-tissue]
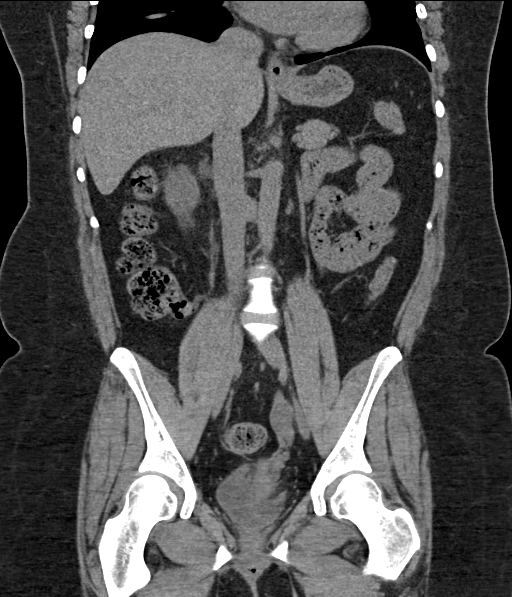

[17 of 46 positions shown; findings below may reference images not displayed]

FINDINGS: Lower chest: No acute findings.

Hepatobiliary: No mass visualized on this unenhanced exam.
Gallbladder is unremarkable.

Pancreas: No mass or inflammatory process visualized on this
unenhanced exam.

Spleen:  Within normal limits in size.

Adrenals/Urinary tract: Mild right hydronephrosis is seen due to a 4
mm calculus at the right ureteropelvic junction. A 2 mm calculus is
seen in the midpole the left kidney, however there is no evidence of
left-sided ureteral calculi or hydronephrosis.

Stomach/Bowel: No evidence of obstruction, inflammatory process, or
abnormal fluid collections. Normal appendix visualized.

Vascular/Lymphatic: No pathologically enlarged lymph nodes
identified. No evidence of abdominal aortic aneurysm.

Reproductive:  No mass or other significant abnormality.

Other:  None.

Musculoskeletal:  No suspicious bone lesions identified.
IMPRESSION: Mild right hydronephrosis due to 4 mm calculus at the right
ureteropelvic junction.

Tiny nonobstructing left renal calculus.

## 2020-01-18 ENCOUNTER — Other Ambulatory Visit: Payer: Self-pay | Admitting: Urology

## 2020-01-18 NOTE — Patient Instructions (Addendum)
DUE TO COVID-19 ONLY ONE VISITOR IS ALLOWED TO COME WITH YOU AND STAY IN THE WAITING ROOM ONLY DURING PRE OP AND PROCEDURE DAY OF SURGERY. THE 1 VISITOR MAY VISIT WITH YOU AFTER SURGERY IN YOUR PRIVATE ROOM DURING VISITING HOURS ONLY!  YOU NEED TO HAVE A COVID 19 TEST ON: 01/21/20 @ 10:00 am , THIS TEST MUST BE DONE BEFORE SURGERY, COME  [address], [place] , [address].  [facility]) ONCE YOUR COVID TEST IS COMPLETED, PLEASE BEGIN THE QUARANTINE INSTRUCTIONS AS OUTLINED IN YOUR HANDOUT.                Felicia Torres    Your procedure is scheduled on: 01/25/20   Report to [facility] Main  Entrance   Report to admitting at: 8:30 AM     Call this number if you have problems the morning of surgery [phone]    Remember: Do not eat solid food :After Midnight. Clear liquids from midnight until: 7:30 am.    CLEAR LIQUID DIET   Foods Allowed                                                                     Foods Excluded  Coffee and tea, regular and decaf                             liquids that you cannot  Plain Jell-O any favor except red or purple                                           see through such as: Fruit ices (not with fruit pulp)                                     milk, soups, orange juice  Iced Popsicles                                    All solid food Carbonated beverages, regular and diet                                    Cranberry, grape and apple juices Sports drinks like Gatorade Lightly seasoned clear broth or consume(fat free) Sugar, honey syrup  Sample Menu Breakfast                                Lunch                                     Supper Cranberry juice                    Beef broth  Chicken broth Jell-O                                     Grape juice                           Apple juice Coffee or tea                        Jell-O                                      Popsicle                                                 Coffee or tea                        Coffee or tea  _____________________________________________________________________  BRUSH YOUR TEETH MORNING OF SURGERY AND RINSE YOUR MOUTH OUT, NO CHEWING GUM CANDY OR MINTS.     Take these medicines the morning of surgery with A SIP OF WATER: tamsulosin               You may not have any metal on your body including hair pins and              piercings  Do not wear jewelry, make-up, lotions, powders or perfumes, deodorant             Do not wear nail polish on your fingernails.  Do not shave  48 hours prior to surgery.         Do not bring valuables to the hospital. [facility] IS NOT             RESPONSIBLE   FOR VALUABLES.  Contacts, dentures or bridgework may not be worn into surgery.  Leave suitcase in the car. After surgery it may be brought to your room.     Patients discharged the day of surgery will not be allowed to drive home. IF YOU ARE HAVING SURGERY AND GOING HOME THE SAME DAY, YOU MUST HAVE AN ADULT TO DRIVE YOU HOME AND BE WITH YOU FOR 24 HOURS. YOU MAY GO HOME BY TAXI OR UBER OR ORTHERWISE, BUT AN ADULT MUST ACCOMPANY YOU HOME AND STAY WITH YOU FOR 24 HOURS.  Name and phone number of your driver:  Special Instructions: N/A              Please read over the following fact sheets you were given: _____________________________________________________________________  [facility] - Preparing for Surgery Before surgery, you can play an important role.  Because skin is not sterile, your skin needs to be as free of germs as possible.  You can reduce the number of germs on your skin by washing with CHG (chlorahexidine gluconate) soap before surgery.  CHG is an antiseptic cleaner which kills germs and bonds with the skin to continue killing germs even after washing. Please DO NOT use if you have an allergy to CHG or antibacterial soaps.  If your skin becomes reddened/irritated stop using the CHG and  inform your nurse when you  arrive at Short Stay. Do not shave (including legs and underarms) for at least 48 hours prior to the first CHG shower.  You may shave your face/neck. Please follow these instructions carefully:  1.  Shower with CHG Soap the night before surgery and the  morning of Surgery.  2.  If you choose to wash your hair, wash your hair first as usual with your  normal  shampoo.  3.  After you shampoo, rinse your hair and body thoroughly to remove the  shampoo.                           4.  Use CHG as you would any other liquid soap.  You can apply chg directly  to the skin and wash                       Gently with a scrungie or clean washcloth.  5.  Apply the CHG Soap to your body ONLY FROM THE NECK DOWN.   Do not use on face/ open                           Wound or open sores. Avoid contact with eyes, ears mouth and genitals (private parts).                       Wash face,  Genitals (private parts) with your normal soap.             6.  Wash thoroughly, paying special attention to the area where your surgery  will be performed.  7.  Thoroughly rinse your body with warm water from the neck down.  8.  DO NOT shower/wash with your normal soap after using and rinsing off  the CHG Soap.                9.  Pat yourself dry with a clean towel.            10.  Wear clean pajamas.            11.  Place clean sheets on your bed the night of your first shower and do not  sleep with pets. Day of Surgery : Do not apply any lotions/deodorants the morning of surgery.  Please wear clean clothes to the hospital/surgery center.  FAILURE TO FOLLOW THESE INSTRUCTIONS MAY RESULT IN THE CANCELLATION OF YOUR SURGERY PATIENT SIGNATURE_________________________________  NURSE SIGNATURE__________________________________  ________________________________________________________________________

## 2020-01-19 ENCOUNTER — Encounter (HOSPITAL_COMMUNITY)
Admission: RE | Admit: 2020-01-19 | Discharge: 2020-01-19 | Disposition: A | Discharge status: Home / Self Care | Payer: Managed Care, Other (non HMO) | Source: Ambulatory Visit | Attending: Urology | Admitting: Urology

## 2020-01-19 ENCOUNTER — Encounter (HOSPITAL_COMMUNITY): Payer: Self-pay

## 2020-01-19 ENCOUNTER — Other Ambulatory Visit: Payer: Self-pay

## 2020-01-19 DIAGNOSIS — Z01812 Encounter for preprocedural laboratory examination: Secondary | ICD-10-CM | POA: Diagnosis present

## 2020-01-19 LAB — CBC
HCT: 36.2 % (ref 36.0–46.0)
Hemoglobin: 11.9 g/dL — ABNORMAL LOW (ref 12.0–15.0)
MCH: 29.4 pg (ref 26.0–34.0)
MCHC: 32.9 g/dL (ref 30.0–36.0)
MCV: 89.4 fL (ref 80.0–100.0)
Platelets: 329 10*3/uL (ref 150–400)
RBC: 4.05 MIL/uL (ref 3.87–5.11)
RDW: 12.7 % (ref 11.5–15.5)
WBC: 8.9 10*3/uL (ref 4.0–10.5)
nRBC: 0 % (ref 0.0–0.2)

## 2020-01-19 LAB — BASIC METABOLIC PANEL
Anion gap: 8 (ref 5–15)
BUN: 11 mg/dL (ref 6–20)
CO2: 26 mmol/L (ref 22–32)
Calcium: 8.7 mg/dL — ABNORMAL LOW (ref 8.9–10.3)
Chloride: 102 mmol/L (ref 98–111)
Creatinine, Ser: 1.46 mg/dL — ABNORMAL HIGH (ref 0.44–1.00)
GFR calc Af Amer: 55 mL/min — ABNORMAL LOW (ref >60)
GFR calc non Af Amer: 47 mL/min — ABNORMAL LOW (ref >60)
Glucose, Bld: 94 mg/dL (ref 70–99)
Potassium: 4.6 mmol/L (ref 3.5–5.1)
Sodium: 136 mmol/L (ref 135–145)

## 2020-01-19 NOTE — Progress Notes (Signed)
COVID Vaccine Completed:yes Date COVID Vaccine completed:09/2019 COVID vaccine manufacturer: Pfizer    Moderna   *[employer]   PCP - No PCP Cardiologist - No  Chest x-ray -  EKG -  Stress Test -  ECHO -  Cardiac Cath -   Sleep Study -  CPAP -   Fasting Blood Sugar -  Checks Blood Sugar _____ times a day  Blood Thinner Instructions: Aspirin Instructions: Last Dose:  Anesthesia review:   Patient denies shortness of breath, fever, cough and chest pain at PAT appointment   Patient verbalized understanding of instructions that were given to them at the PAT appointment. Patient was also instructed that they will need to review over the PAT instructions again at home before surgery.

## 2020-01-21 ENCOUNTER — Other Ambulatory Visit (HOSPITAL_COMMUNITY)
Admission: RE | Admit: 2020-01-21 | Discharge: 2020-01-21 | Disposition: A | Discharge status: Home / Self Care | Payer: Managed Care, Other (non HMO) | Source: Ambulatory Visit | Attending: Urology | Admitting: Urology

## 2020-01-21 DIAGNOSIS — Z01812 Encounter for preprocedural laboratory examination: Secondary | ICD-10-CM | POA: Insufficient documentation

## 2020-01-21 DIAGNOSIS — Z20822 Contact with and (suspected) exposure to covid-19: Secondary | ICD-10-CM | POA: Insufficient documentation

## 2020-01-21 LAB — SARS CORONAVIRUS 2 (TAT 6-24 HRS): SARS Coronavirus 2: NEGATIVE

## 2020-01-24 ENCOUNTER — Other Ambulatory Visit (HOSPITAL_COMMUNITY): Payer: Managed Care, Other (non HMO)

## 2020-01-25 ENCOUNTER — Ambulatory Visit (HOSPITAL_COMMUNITY)
Admission: RE | Admit: 2020-01-25 | Discharge: 2020-01-25 | Disposition: A | Discharge status: Home / Self Care | Payer: Managed Care, Other (non HMO) | Attending: Urology | Admitting: Urology

## 2020-01-25 ENCOUNTER — Ambulatory Visit (HOSPITAL_COMMUNITY): Payer: Managed Care, Other (non HMO) | Admitting: Certified Registered"

## 2020-01-25 ENCOUNTER — Encounter (HOSPITAL_COMMUNITY): Payer: Self-pay | Admitting: Urology

## 2020-01-25 ENCOUNTER — Encounter (HOSPITAL_COMMUNITY)
Admission: RE | Disposition: A | Discharge status: Home / Self Care | Payer: Self-pay | Source: Home / Self Care | Attending: Urology

## 2020-01-25 ENCOUNTER — Ambulatory Visit (HOSPITAL_COMMUNITY): Payer: Managed Care, Other (non HMO)

## 2020-01-25 DIAGNOSIS — Z79891 Long term (current) use of opiate analgesic: Secondary | ICD-10-CM | POA: Diagnosis not present

## 2020-01-25 DIAGNOSIS — Z79899 Other long term (current) drug therapy: Secondary | ICD-10-CM | POA: Diagnosis not present

## 2020-01-25 DIAGNOSIS — N132 Hydronephrosis with renal and ureteral calculous obstruction: Secondary | ICD-10-CM | POA: Diagnosis not present

## 2020-01-25 DIAGNOSIS — N200 Calculus of kidney: Secondary | ICD-10-CM

## 2020-01-25 HISTORY — PX: CYSTOSCOPY WITH RETROGRADE PYELOGRAM, URETEROSCOPY AND STENT PLACEMENT: SHX5789

## 2020-01-25 LAB — PREGNANCY, URINE: Preg Test, Ur: NEGATIVE

## 2020-01-25 SURGERY — CYSTOURETEROSCOPY, WITH RETROGRADE PYELOGRAM AND STENT INSERTION
Anesthesia: General | Laterality: Left

## 2020-01-25 MED ORDER — ACETAMINOPHEN 325 MG PO TABS: 325.0000 mg | ORAL_TABLET | Freq: Once | ORAL | Status: DC | PRN

## 2020-01-25 MED ORDER — ACETAMINOPHEN 160 MG/5ML PO SOLN: 325.0000 mg | Freq: Once | ORAL | Status: DC | PRN

## 2020-01-25 MED ORDER — BELLADONNA ALKALOIDS-OPIUM 16.2-30 MG RE SUPP
RECTAL | Status: AC
  Filled 2020-01-25: qty 1

## 2020-01-25 MED ORDER — LACTATED RINGERS IV SOLN: INTRAVENOUS | Status: DC

## 2020-01-25 MED ORDER — CHLORHEXIDINE GLUCONATE 0.12 % MT SOLN
15.0000 mL | Freq: Once | OROMUCOSAL | Status: AC
  Administered 2020-01-25: 15 mL via OROMUCOSAL

## 2020-01-25 MED ORDER — ONDANSETRON HCL 4 MG/2ML IJ SOLN
INTRAMUSCULAR | Status: DC | PRN
  Administered 2020-01-25: 4 mg via INTRAVENOUS

## 2020-01-25 MED ORDER — PROPOFOL 10 MG/ML IV BOLUS
INTRAVENOUS | Status: DC | PRN
  Administered 2020-01-25: 170 mg via INTRAVENOUS

## 2020-01-25 MED ORDER — ONDANSETRON HCL 4 MG/2ML IJ SOLN
INTRAMUSCULAR | Status: AC
  Filled 2020-01-25: qty 2

## 2020-01-25 MED ORDER — ORAL CARE MOUTH RINSE: 15.0000 mL | Freq: Once | OROMUCOSAL | Status: AC

## 2020-01-25 MED ORDER — MEPERIDINE HCL 50 MG/ML IJ SOLN: 6.2500 mg | INTRAMUSCULAR | Status: DC | PRN

## 2020-01-25 MED ORDER — DEXAMETHASONE SODIUM PHOSPHATE 10 MG/ML IJ SOLN
INTRAMUSCULAR | Status: DC | PRN
  Administered 2020-01-25: 4 mg via INTRAVENOUS

## 2020-01-25 MED ORDER — PROPOFOL 10 MG/ML IV BOLUS
INTRAVENOUS | Status: AC
  Filled 2020-01-25: qty 20

## 2020-01-25 MED ORDER — DEXAMETHASONE SODIUM PHOSPHATE 10 MG/ML IJ SOLN
INTRAMUSCULAR | Status: AC
  Filled 2020-01-25: qty 1

## 2020-01-25 MED ORDER — MIDAZOLAM HCL 2 MG/2ML IJ SOLN
INTRAMUSCULAR | Status: AC
  Filled 2020-01-25: qty 2

## 2020-01-25 MED ORDER — CEFAZOLIN SODIUM-DEXTROSE 2-4 GM/100ML-% IV SOLN
2.0000 g | INTRAVENOUS | Status: AC
  Administered 2020-01-25: 2 g via INTRAVENOUS
  Filled 2020-01-25: qty 100

## 2020-01-25 MED ORDER — FENTANYL CITRATE (PF) 100 MCG/2ML IJ SOLN: 25.0000 ug | INTRAMUSCULAR | Status: DC | PRN

## 2020-01-25 MED ORDER — IOHEXOL 300 MG/ML  SOLN
INTRAMUSCULAR | Status: DC | PRN
  Administered 2020-01-25: 16 mL

## 2020-01-25 MED ORDER — FENTANYL CITRATE (PF) 100 MCG/2ML IJ SOLN
INTRAMUSCULAR | Status: DC | PRN
  Administered 2020-01-25 (×2): 25 ug via INTRAVENOUS
  Administered 2020-01-25: 50 ug via INTRAVENOUS

## 2020-01-25 MED ORDER — PHENAZOPYRIDINE HCL 200 MG PO TABS
200.0000 mg | ORAL_TABLET | Freq: Three times a day (TID) | ORAL | 0 refills | Status: DC | PRN
Start: 2020-01-25 — End: 2023-11-11

## 2020-01-25 MED ORDER — PROMETHAZINE HCL 25 MG/ML IJ SOLN: 6.2500 mg | INTRAMUSCULAR | Status: DC | PRN

## 2020-01-25 MED ORDER — EPHEDRINE SULFATE-NACL 50-0.9 MG/10ML-% IV SOSY
PREFILLED_SYRINGE | INTRAVENOUS | Status: DC | PRN
  Administered 2020-01-25: 10 mg via INTRAVENOUS

## 2020-01-25 MED ORDER — LIDOCAINE 2% (20 MG/ML) 5 ML SYRINGE
INTRAMUSCULAR | Status: DC | PRN
  Administered 2020-01-25: 50 mg via INTRAVENOUS

## 2020-01-25 MED ORDER — LIDOCAINE 2% (20 MG/ML) 5 ML SYRINGE
INTRAMUSCULAR | Status: AC
  Filled 2020-01-25: qty 5

## 2020-01-25 MED ORDER — CIPROFLOXACIN HCL 500 MG PO TABS
500.0000 mg | ORAL_TABLET | Freq: Once | ORAL | 0 refills | Status: AC
Start: 2020-01-25 — End: 2020-01-25

## 2020-01-25 MED ORDER — FENTANYL CITRATE (PF) 100 MCG/2ML IJ SOLN
INTRAMUSCULAR | Status: AC
  Filled 2020-01-25: qty 2

## 2020-01-25 MED ORDER — ACETAMINOPHEN 10 MG/ML IV SOLN: 1000.0000 mg | Freq: Once | INTRAVENOUS | Status: DC | PRN

## 2020-01-25 MED ORDER — MIDAZOLAM HCL 2 MG/2ML IJ SOLN
INTRAMUSCULAR | Status: DC | PRN
  Administered 2020-01-25: 2 mg via INTRAVENOUS

## 2020-01-25 MED ORDER — SODIUM CHLORIDE 0.9 % IR SOLN
Status: DC | PRN
  Administered 2020-01-25: 1000 mL

## 2020-01-25 MED ORDER — BELLADONNA ALKALOIDS-OPIUM 16.2-60 MG RE SUPP
RECTAL | Status: DC | PRN
  Administered 2020-01-25: 1 via RECTAL

## 2020-01-25 SURGICAL SUPPLY — 18 items
BAG URO CATCHER STRL LF (MISCELLANEOUS) ×3 IMPLANT
BASKET ZERO TIP NITINOL 2.4FR (BASKET) IMPLANT
CATH URET 5FR 28IN OPEN ENDED (CATHETERS) ×3 IMPLANT
CLOTH BEACON ORANGE TIMEOUT ST (SAFETY) ×3 IMPLANT
EXTRACTOR STONE 1.7FRX115CM (UROLOGICAL SUPPLIES) IMPLANT
GLOVE BIOGEL M STRL SZ7.5 (GLOVE) ×3 IMPLANT
GOWN STRL REUS W/TWL XL LVL3 (GOWN DISPOSABLE) ×3 IMPLANT
GUIDEWIRE ANG ZIPWIRE 038X150 (WIRE) IMPLANT
GUIDEWIRE STR DUAL SENSOR (WIRE) ×6 IMPLANT
KIT TURNOVER KIT A (KITS) IMPLANT
MANIFOLD NEPTUNE II (INSTRUMENTS) ×3 IMPLANT
PACK CYSTO (CUSTOM PROCEDURE TRAY) ×3 IMPLANT
SHEATH URETERAL 12FRX28CM (UROLOGICAL SUPPLIES) IMPLANT
SHEATH URETERAL 12FRX35CM (MISCELLANEOUS) IMPLANT
STENT URET 6FRX24 CONTOUR (STENTS) ×3 IMPLANT
TUBING CONNECTING 10 (TUBING) ×2 IMPLANT
TUBING CONNECTING 10' (TUBING) ×1
TUBING UROLOGY SET (TUBING) ×3 IMPLANT

## 2020-01-25 NOTE — Anesthesia Postprocedure Evaluation (Signed)
Anesthesia Post Note  Patient: Solon Palm  Procedure(s) Performed: CYSTOSCOPY WITH LEFT RETROGRADE PYELOGRAM, URETEROSCOPY, STENT PLACEMENT (Left )     Patient location during evaluation: PACU Anesthesia Type: General Level of consciousness: awake and alert Pain management: pain level controlled Vital Signs Assessment: post-procedure vital signs reviewed and stable Respiratory status: spontaneous breathing, nonlabored ventilation, respiratory function stable and patient connected to nasal cannula oxygen Cardiovascular status: blood pressure returned to baseline and stable Postop Assessment: no apparent nausea or vomiting Anesthetic complications: no   No complications documented.  Last Vitals:  Vitals:   01/25/20 1200 01/25/20 1211  BP: 108/79 108/81  Pulse: 76 77  Resp:  18  Temp:  36.7 C  SpO2: 98% 98%    Last Pain:  Vitals:   01/25/20 1211  TempSrc: Oral  PainSc: 0-No pain                 Shelton Silvas

## 2020-01-25 NOTE — Interval H&P Note (Signed)
History and Physical Interval Note:  01/25/2020 10:16 AM  Felicia Torres  has presented today for surgery, with the diagnosis of LEFT URETERAL STONE.  The various methods of treatment have been discussed with the patient and family. After consideration of risks, benefits and other options for treatment, the patient has consented to  Procedure(s): CYSTOSCOPY WITH LEFT RETROGRADE PYELOGRAM, URETEROSCOPY WITH HOLMIUM LASER AND STENT PLACEMENT (Left) as a surgical intervention.  The patient's history has been reviewed, patient examined, no change in status, stable for surgery.  I have reviewed the patient's chart and labs.  Questions were answered to the patient's satisfaction.     Crist Fat

## 2020-01-25 NOTE — Transfer of Care (Signed)
Immediate Anesthesia Transfer of Care Note  Patient: Felicia Torres  Procedure(s) Performed: CYSTOSCOPY WITH LEFT RETROGRADE PYELOGRAM, URETEROSCOPY, STENT PLACEMENT (Left )  Patient Location: PACU  Anesthesia Type:General  Level of Consciousness: awake, alert , oriented and patient cooperative  Airway & Oxygen Therapy: Patient Spontanous Breathing and Patient connected to face mask oxygen  Post-op Assessment: Report given to RN, Post -op Vital signs reviewed and stable and Patient moving all extremities  Post vital signs: Reviewed and stable  Last Vitals:  Vitals Value Taken Time  BP 122/81 01/25/20 1133  Temp 36.7 C 01/25/20 1133  Pulse 89 01/25/20 1136  Resp 13 01/25/20 1136  SpO2 100 % 01/25/20 1136  Vitals shown include unvalidated device data.  Last Pain:  Vitals:   01/25/20 1133  TempSrc:   PainSc: 0-No pain         Complications: No complications documented.

## 2020-01-25 NOTE — Op Note (Signed)
Preoperative diagnosis:  1. Left proximal ureteral stone  Postoperative diagnosis:  1. Passed left proximal ureteral stone  Procedure: 1. Cystoscopy, left retrograde pyelogram with interpretation 2. Left diagnostic ureteroscopy 3. Left ureteral stent placement  Surgeon: Crist Fat, MD  Anesthesia: General  Complications: None  Intraoperative findings:  #1: The left retrograde pyelogram was performed with 10 cc of Omnipaque contrast through a 5 [place] open-ended ureteral catheter.  This demonstrated normal caliber ureter with no filling defects or significant abnormalities.  There is no hydroureteronephrosis. #2:  Ureteroscopy demonstrated no stones within the ureter or renal pelvis. #3: A 24 cm time 6 French double-J ureteral stent was left at the end of the case, stent tether left intact and tucked into the patient's vagina.  EBL: Minimal  Specimens: None  Indication: Felicia Torres is a 34 y.o. patient with symptomatic left proximal ureteral stone that was not visible on KUB.  After reviewing the management options for treatment, he elected to proceed with the above surgical procedure(s). We have discussed the potential benefits and risks of the procedure, side effects of the proposed treatment, the likelihood of the patient achieving the goals of the procedure, and any potential problems that might occur during the procedure or recuperation. Informed consent has been obtained.  Description of procedure:  The patient was taken to the operating room and general anesthesia was induced.  The patient was placed in the dorsal lithotomy position, prepped and draped in the usual sterile fashion, and preoperative antibiotics were administered. A preoperative time-out was performed.   21 French 0 degree cystoscope was gently passed to the patient's urethra and into the bladder under visual guidance.  Cystoscopy was performed in the routine fashion with normal anatomy and no  unusual findings.  5 [place] open-ended catheter was then used to perform retrograde pyelogram with the above findings.  I then advanced a wire up through the open-ended catheter remove the catheter over the wire.  I then removed the cystoscope and exchanged for the dual-lumen semirigid ureteroscope.  I was able to easily passes through the patient's urethra and cannulate the left ureteral orifice advancing all the way up to the renal pelvis with no significant abnormalities or stones.  As I was removing the scope I advanced a second wire through the scope and into the left renal pelvis removing the scope over the wire.  Subsequently advanced a single lumen flexible ureteroscope over the second wire and was able to easily advance this up through the ureter and into the renal pelvis.  I then performed pyeloscopy using fluoroscopic guidance to ensure that all calyces had been evaluated.  Again, there were no stones.  I double checked the CT scan noting a small stone in the proximal left ureter.  She clearly passed this.  This point, I opted to terminate the case and slowly removed the ureteroscope under visual guidance noting no trauma or significant issues on the ureter.  I then advanced a 24 cm time 6 French double-J stent over the wire and advanced it to the urethral meatus noting a nice curl in the patient's left renal pelvis.  I then held the stent in place and remove the wire and the stent was noted to pop into the patient's bladder.  Stent tether was brought through the urethra and tucked into the patient's vagina.  BNO suppositories placed in the patient's rectum.  She was subsequently extubated return the PACU in good condition.  Disposition: The patient will be instructed  to remove the stent in 2 days.  Crist Fat, M.D.

## 2020-01-25 NOTE — Anesthesia Procedure Notes (Signed)
Procedure Name: LMA Insertion Date/Time: 01/25/2020 10:34 AM Performed by: Nelle Don, CRNA Pre-anesthesia Checklist: Patient identified, Emergency Drugs available, Suction available and Patient being monitored Patient Re-evaluated:Patient Re-evaluated prior to induction Oxygen Delivery Method: Circle system utilized Preoxygenation: Pre-oxygenation with 100% oxygen Induction Type: IV induction LMA: LMA inserted LMA Size: 4.0 Number of attempts: 1 Dental Injury: Teeth and Oropharynx as per pre-operative assessment

## 2020-01-25 NOTE — Anesthesia Preprocedure Evaluation (Signed)
Anesthesia Evaluation  Patient identified by MRN, date of birth, ID band Patient awake    Reviewed: Allergy & Precautions, NPO status , Patient's Chart, lab work & pertinent test results  Airway Mallampati: I  TM Distance: >3 FB Neck ROM: Full    Dental  (+) Teeth Intact, Dental Advisory Given   Pulmonary former smoker,    breath sounds clear to auscultation       Cardiovascular negative cardio ROS   Rhythm:Regular Rate:Normal     Neuro/Psych negative neurological ROS  negative psych ROS   GI/Hepatic negative GI ROS, Neg liver ROS,   Endo/Other    Renal/GU Renal InsufficiencyRenal disease     Musculoskeletal   Abdominal Normal abdominal exam  (+)   Peds  Hematology   Anesthesia Other Findings   Reproductive/Obstetrics                             Anesthesia Physical Anesthesia Plan  ASA: II  Anesthesia Plan: General   Post-op Pain Management:    Induction: Intravenous  PONV Risk Score and Plan: 4 or greater and Ondansetron, Dexamethasone, Midazolam and Scopolamine patch - Pre-op  Airway Management Planned: LMA  Additional Equipment: None  Intra-op Plan:   Post-operative Plan: Extubation in OR  Informed Consent: I have reviewed the patients History and Physical, chart, labs and discussed the procedure including the risks, benefits and alternatives for the proposed anesthesia with the patient or authorized representative who has indicated his/her understanding and acceptance.     Dental advisory given  Plan Discussed with: CRNA  Anesthesia Plan Comments:         Anesthesia Quick Evaluation

## 2020-01-25 NOTE — H&P (Signed)
Non-obstructing stone follow-up  HPI: Felicia Torres is a 34 year-old female established patient who is here today for interval evaluation of non-obstructing kidney stones.  11/09/17: The patient presented initially with a septic stone and underwent stent placement followed by 2 weeks of antibiotics. She subsequently has had her stone removed.   The patient presents today to discuss treatment of the stone on the left which is nonobstructing in the upper pole. She is headed out of the country in several weeks, and is eager to get this stone treated.    12/09/17: She returns today for follow up with [facility]. She denies any current flank or abdominal pain. No gross hematuria or exacerbation of voiding symptoms. No nausea or vomiting. Stone composition was noted to be calcium oxalate. She does have known left upper pole calculus that is non obstructing.   Interval 01/16/20: Patient with above-noted history. She presents today with stone event. She was seen while out of town on 6/25 at local emergency department with complaints of left abdominal pain. CT imaging revealed an approximately 6 x 4 mm left UPJ calculus with associated hydronephrosis. She was given medication for pain and nausea. Today she states that she continues to have persistent symptoms, though symptoms have been controlled with medication. However, she is having a hard time tolerating medications due to drowsiness. She currently has mild left flank pain. She denies any current nausea or vomiting. She denies any significant exacerbation and lower urinary tract symptoms. No complaints of fevers or chills.   The patient has not passed any stones since they were last seen. She has not had any flank pain since in the interval. The patient had 3cmx1.2cm.   She denies dysuria. She does not have hematuria. She has not had fever and chills.   She has had kidney stone surgery. She underwent ureteroscopy. Her last stone surgery was April 10th, 2019.    The patient has not completed a 24 hour urine collection in the past. The patient is not taking any medications for stone prevention.   The patient underwent CT scan prior to today's appointment.     ALLERGIES: None   MEDICATIONS: Ketorolac Tromethamine 10 mg tablet  Tamsulosin Hcl 0.4 mg capsule 1 capsule PO Daily  Tamsulosin Hcl 0.4 mg capsule 1 capsule PO Daily  Vicodin  Oxycodone-Acetaminophen 5 mg-325 mg tablet  Promethazine Hcl 25 mg tablet     GU PSH: Cystoscopy Insert Stent, Right - 2019 Ureteroscopic laser litho, Right - 2019     NON-GU PSH: None   GU PMH: Renal calculus - 2019, - 2019 Ureteral calculus - 2019    NON-GU PMH: None   FAMILY HISTORY: Kidney Cancer - Father Kidney Stones - Sister   SOCIAL HISTORY: Marital Status: Single Preferred Language: English; Race: White Current Smoking Status: Patient has never smoked.   Tobacco Use Assessment Completed: Used Tobacco in last 30 days? Does drink.  Drinks 1 caffeinated drink per day. Has not had a blood transfusion.    REVIEW OF SYSTEMS:    GU Review Female:   Patient denies frequent urination, hard to postpone urination, burning /pain with urination, get up at night to urinate, leakage of urine, stream starts and stops, trouble starting your stream, have to strain to urinate, and being pregnant.  Gastrointestinal (Upper):   Patient denies nausea, vomiting, and indigestion/ heartburn.  Gastrointestinal (Lower):   Patient denies diarrhea and constipation.  Constitutional:   Patient denies fever, night sweats, weight loss, and fatigue.  Skin:  Patient denies skin rash/ lesion and itching.  Eyes:   Patient denies blurred vision and double vision.  Ears/ Nose/ Throat:   Patient denies sinus problems and sore throat.  Hematologic/Lymphatic:   Patient denies swollen glands and easy bruising.  Cardiovascular:   Patient denies leg swelling and chest pains.  Respiratory:   Patient denies cough and shortness of  breath.  Endocrine:   Patient denies excessive thirst.  Musculoskeletal:   Patient denies back pain and joint pain.  Neurological:   Patient denies headaches and dizziness.  Psychologic:   Patient denies depression and anxiety.   VITAL SIGNS:      01/16/2020 09:12 AM  Weight 175 lb / 79.38 kg  Height 64 in / 162.56 cm  BP 103/72 mmHg  Pulse 90 /min  Temperature 98.0 F / 36.6 C  BMI 30.0 kg/m   MULTI-SYSTEM PHYSICAL EXAMINATION:    Constitutional: Well-nourished. No physical deformities. Normally developed. Good grooming.  Respiratory: Normal breath sounds. No labored breathing, no use of accessory muscles.   Cardiovascular: Regular rate and rhythm. No murmur, no gallop. Normal temperature, normal extremity pulses, no swelling, no varicosities.   Neurologic / Psychiatric: Oriented to time, oriented to place, oriented to person. No depression, no anxiety, no agitation.  Gastrointestinal: No mass, no tenderness, no rigidity, non obese abdomen. Mild left CVAT.   Musculoskeletal: Normal gait and station of head and neck.     Complexity of Data:  Records Review:   Previous Patient Records  Urine Test Review:   Urinalysis  X-Ray Review: C.T. Abdomen/Pelvis: Reviewed Films. Reviewed Report.     PROCEDURES:         KUB - [id number]  A single view of the abdomen is obtained. No obvious opacities noted within the confines of the right renal shadow or along the expected anatomical course of the right ureter. No obvious opacities noted within the confines of the left renal shadow. Along the expected anatomical course of the left ureter, there continues to be an opacity consistent with stone noted on CT imaging between transverse process of L3 and L4. Normal appearance of overlying bowel gas pattern. Multiple pelvic phleboliths noted, which appear stable. At      Patient confirmed No Neulasta OnPro Device.           Urinalysis w/Scope Dipstick Dipstick Cont'd Micro  Color: Yellow Bilirubin:  Neg mg/dL WBC/hpf: NS (Not Seen)  Appearance: Cloudy Ketones: Neg mg/dL RBC/hpf: NS (Not Seen)  Specific Gravity: 1.025 Blood: Neg ery/uL Bacteria: Few (10-25/hpf)  pH: 6.0 Protein: Trace mg/dL Cystals: Amorph Urates  Glucose: Neg mg/dL Urobilinogen: 0.2 mg/dL Casts: NS (Not Seen)    Nitrites: Neg Trichomonas: Not Present    Leukocyte Esterase: Neg leu/uL Mucous: Present      Epithelial Cells: 6 - 10/hpf      Yeast: NS (Not Seen)      Sperm: Not Present    Notes: microscopic not concentrated.    ASSESSMENT:      ICD-10 Details  1 GU:   Renal calculus - N20.0   2   Ureteral calculus - N20.1    PLAN:            Medications New Meds: Tamsulosin Hcl 0.4 mg capsule 1 capsule PO Daily   #30  0 Refill(s)    Refill Meds: Tamsulosin Hcl 0.4 mg capsule 1 capsule PO Daily   #90  0 Refill(s)  Tamsulosin Hcl 0.4 mg capsule 1 capsule PO Daily   #90  0 Refill(s)            Orders Labs Urine Culture  X-Rays: KUB  X-Ray Notes: History:  Hematuria: Yes/No  Patient to see MD after exam: Yes/No  Previous exam: CT / IVP/ US/ KUB/ None  When:  Where:  Diabetic: Yes/ No  BUN/ Creatinine:  Date of last BUN Creatinine:  Weight in pounds:  Allergy- IV Contrast: Yes/ No  Conflicting diabetic meds: Yes/ No  Diabetic Meds:  Prior Authorization #:            Schedule         Document Letter(s):  Created for Patient: Clinical Summary         Notes:   Precautionary culture sent. On KUB imaging, along the expected anatomical course of the left ureter, there continues to be an opacity consistent with stone noted on CT imaging between transverse process of L3 and L4. This does not appear to have progressed much when compared with CT imaging. We reviewed treatment options today in detail and she would like to consider intervention at this point in time. We discussed both ESWL and ureteroscopy. She would like to consider moving forward with ureteroscopy for intervention. We reviewed  the procedure and potential risks today in detail. I will review with her urologist for his recommendations moving forward in keep her updated. I am going to have her begin tamsulosin to see if this would be beneficial. Prescription sent to her pharmacy. We reviewed indications, usage, potential side effects. I encouraged that she hydrate and strain her urine. Return precautions reviewed in the interim for fevers or refractory pain, nausea, or vomiting. She voiced understanding.

## 2020-01-25 NOTE — Discharge Instructions (Signed)
DISCHARGE INSTRUCTIONS FOR KIDNEY STONE/URETERAL STENT   MEDICATIONS:  1. Resume all your other meds from home - except do not take any extra narcotic pain meds that you may have at home.  2. Pyridium is to help with the burning/stinging when you urinate. 3. Take Cipro one hour prior to removal of your stent.   ACTIVITY:  1. No strenuous activity x 1week  2. No driving while on narcotic pain medications  3. Drink plenty of water  4. Continue to walk at home - you can still get blood clots when you are at home, so keep active, but don't over do it.  5. May return to work/school tomorrow or when you feel ready   BATHING:  1. You can shower and we recommend daily showers  2. You have a string coming from your urethra: The stent string is attached to your ureteral stent. Do not pull on this.   SIGNS/SYMPTOMS TO CALL:  Please call us if you have a fever greater than 101.5, uncontrolled nausea/vomiting, uncontrolled pain, dizziness, unable to urinate, bloody urine, chest pain, shortness of breath, leg swelling, leg pain, redness around wound, drainage from wound, or any other concerns or questions.   You can reach [place] at [phone].   FOLLOW-UP:  1. You have an appointment in 6 weeks with a ultrasound of your kidneys prior.   2. You have a string attached to your stent, you may remove it on Friday.  To do this, pull the strings until the stents are completely removed. You may feel an odd sensation in your back.

## 2020-01-26 ENCOUNTER — Encounter (HOSPITAL_COMMUNITY): Payer: Self-pay | Admitting: Urology

## 2020-08-31 ENCOUNTER — Emergency Department (HOSPITAL_BASED_OUTPATIENT_CLINIC_OR_DEPARTMENT_OTHER)
Admission: EM | Admit: 2020-08-31 | Discharge: 2020-08-31 | Disposition: A | Discharge status: Home / Self Care | Payer: Managed Care, Other (non HMO) | Attending: Emergency Medicine | Admitting: Emergency Medicine

## 2020-08-31 ENCOUNTER — Encounter (HOSPITAL_BASED_OUTPATIENT_CLINIC_OR_DEPARTMENT_OTHER): Payer: Self-pay | Admitting: *Deleted

## 2020-08-31 ENCOUNTER — Other Ambulatory Visit: Payer: Self-pay

## 2020-08-31 DIAGNOSIS — R519 Headache, unspecified: Secondary | ICD-10-CM | POA: Insufficient documentation

## 2020-08-31 DIAGNOSIS — R059 Cough, unspecified: Secondary | ICD-10-CM | POA: Diagnosis not present

## 2020-08-31 DIAGNOSIS — R0981 Nasal congestion: Secondary | ICD-10-CM | POA: Diagnosis not present

## 2020-08-31 DIAGNOSIS — Z87891 Personal history of nicotine dependence: Secondary | ICD-10-CM | POA: Insufficient documentation

## 2020-08-31 NOTE — ED Triage Notes (Signed)
C/o h/a x 2 weeks , denies head injury

## 2020-08-31 NOTE — ED Provider Notes (Signed)
[facility] EMERGENCY DEPARTMENT Provider Note   CSN: [id number] Arrival date & time: 08/31/20  1327     History Chief Complaint  Patient presents with  . Headache    Felicia Torres is a 35 y.o. female.  The history is provided by the patient and medical records.  Headache  Felicia Torres is a 35 y.o. female who presents to the Emergency Department complaining of headache.  She presents to the ED complaining of two weeks of "sinus headache."  HA was initially bi-temporal. Over the last three days pain is now in the right occipital region with mild associated cough, which worsens her headache. Has minimal nasal congestion - not unusual for her.   Denies fever, sore throat, N/V/D, vision changes.  Has no medical problems, takes no medications.  Does not take OCP.  No chance of pregnancy.  Has been vaccinated for COVID 19.  No known sick contacts.    No risk factors for carbon monoxide exposures. Has a hx/o uncommon migraine ha (few times per year).  Has tried ibuprofen and acetaminophen - HA resolved on ED presentation.      Past Medical History:  Diagnosis Date  . Family history of adverse reaction to anesthesia   . History of kidney stones   . Retinitis    Unsure about what eye     There are no problems to display for this patient.   Past Surgical History:  Procedure Laterality Date  . CYSTOSCOPY WITH RETROGRADE PYELOGRAM, URETEROSCOPY AND STENT PLACEMENT Left 01/25/2020   Procedure: CYSTOSCOPY WITH LEFT RETROGRADE PYELOGRAM, URETEROSCOPY, STENT PLACEMENT;  Surgeon: Crist Fat, MD;  Location: [facility];  Service: Urology;  Laterality: Left;  . URETEROSCOPY Right 10/15/2017   Procedure: cystoscopy, right retrograde, right stent placement;  Surgeon: Crist Fat, MD;  Location: [facility];  Service: Urology;  Laterality: Right;  right ureteroscopy , laser litho, right ureterala stent placement  . WISDOM TOOTH EXTRACTION       OB  History   No obstetric history on file.     Family History  Problem Relation Age of Onset  . Cancer Father     Social History   Tobacco Use  . Smoking status: Former Smoker    Packs/day: 1.00    Years: 10.00    Pack years: 10.00    Types: Cigarettes  . Smokeless tobacco: Never Used  . Tobacco comment: pt. said she stop few moths ago  Vaping Use  . Vaping Use: Never used  Substance Use Topics  . Alcohol use: Yes    Alcohol/week: 1.0 standard drink    Types: 1 Standard drinks or equivalent per week    Comment: occas.  . Drug use: Not Currently    Types: Hydrocodone    Home Medications Prior to Admission medications   Medication Sig Start Date End Date Taking? Authorizing Provider  ketorolac (TORADOL) 10 MG tablet Take 10 mg by mouth every 6 (six) hours as needed for moderate pain.  01/13/20   [provider]  PERCOCET 5-325 MG tablet Take 1 tablet by mouth every 6 (six) hours as needed. 01/17/20   [provider]  phenazopyridine (PYRIDIUM) 200 MG tablet Take 1 tablet (200 mg total) by mouth 3 (three) times daily as needed for pain. 01/25/20   Crist Fat, MD  promethazine (PHENERGAN) 12.5 MG tablet Take 12.5 mg by mouth every 6 (six) hours as needed. 01/17/20   [provider]  tamsulosin (  FLOMAX) 0.4 MG CAPS capsule Take 0.4 mg by mouth daily. 01/16/20   [provider]    Allergies    Blueberry flavor  Review of Systems   Review of Systems  Neurological: Positive for headaches.  All other systems reviewed and are negative.   Physical Exam Updated Vital Signs BP 125/82   Pulse 92   Temp 98.3 F (36.8 C) (Oral)   Resp 16   Ht 5\' 4"  (1.626 m)   Wt 81.6 kg   LMP 08/16/2020   SpO2 100%   BMI 30.90 kg/m   Physical Exam Vitals and nursing note reviewed.  Constitutional:      Appearance: She is well-developed and well-nourished.  HENT:     Head: Normocephalic and atraumatic.     Right Ear: Tympanic membrane normal.      Left Ear: Tympanic membrane normal.  Eyes:     Extraocular Movements: Extraocular movements intact.     Pupils: Pupils are equal, round, and reactive to light.  Cardiovascular:     Rate and Rhythm: Normal rate and regular rhythm.     Heart sounds: No murmur heard.   Pulmonary:     Effort: Pulmonary effort is normal. No respiratory distress.     Breath sounds: Normal breath sounds.  Abdominal:     Palpations: Abdomen is soft.     Tenderness: There is no abdominal tenderness. There is no guarding or rebound.  Musculoskeletal:        General: No tenderness or edema.  Skin:    General: Skin is warm and dry.  Neurological:     Mental Status: She is alert and oriented to person, place, and time.     Comments: No asymmetry of facial movements.  Visual fields grossly intact.  5/5 strength in all four extremities.    Psychiatric:        Mood and Affect: Mood and affect normal.        Behavior: Behavior normal.     ED Results / Procedures / Treatments   Labs (all labs ordered are listed, but only abnormal results are displayed) Labs Reviewed - No data to display  EKG None  Radiology No results found.  Procedures Procedures   Medications Ordered in ED Medications - No data to display  ED Course  I have reviewed the triage vital signs and the nursing notes.  Pertinent labs & imaging results that were available during my care of the patient were reviewed by me and considered in my medical decision making (see chart for details).    MDM Rules/Calculators/A&P                         patient here for evaluation of two weeks of Bi- temporal headache that is now located in the right occipital region for the last three days. She is neurologically intact on evaluation. Headache is currently resolved after OTC medications taken at home. Presentation is not consistent with aneurysm, subarachnoid hemorrhage, meningitis, dural sinus thrombosis, space occupying lesion. Discussed with  patient home care for headache. Offered COVID-19 testing, but patient has an appointment for the scheduled for later today. Plan to discharge home with outpatient follow-up and return precautions.   **Final Clinical Impression(s) / ED Diagnoses Final diagnoses:  Acute nonintractable headache, unspecified headache type    Rx / DC Orders ED Discharge Orders    None       08/18/2020, MD 08/31/20 1555

## 2023-09-24 ENCOUNTER — Encounter: Payer: Self-pay | Admitting: Physician Assistant

## 2023-09-24 ENCOUNTER — Other Ambulatory Visit: Payer: Self-pay

## 2023-09-24 ENCOUNTER — Emergency Department (HOSPITAL_COMMUNITY)
Admission: EM | Admit: 2023-09-24 | Discharge: 2023-09-24 | Disposition: A | Discharge status: Home / Self Care | Attending: Emergency Medicine | Admitting: Emergency Medicine

## 2023-09-24 ENCOUNTER — Emergency Department (HOSPITAL_COMMUNITY)

## 2023-09-24 DIAGNOSIS — R109 Unspecified abdominal pain: Secondary | ICD-10-CM

## 2023-09-24 DIAGNOSIS — R1011 Right upper quadrant pain: Secondary | ICD-10-CM | POA: Diagnosis present

## 2023-09-24 DIAGNOSIS — K2900 Acute gastritis without bleeding: Secondary | ICD-10-CM

## 2023-09-24 LAB — CBC
HCT: 38.7 % (ref 36.0–46.0)
Hemoglobin: 13 g/dL (ref 12.0–15.0)
MCH: 29 pg (ref 26.0–34.0)
MCHC: 33.6 g/dL (ref 30.0–36.0)
MCV: 86.2 fL (ref 80.0–100.0)
Platelets: 327 10*3/uL (ref 150–400)
RBC: 4.49 MIL/uL (ref 3.87–5.11)
RDW: 13.3 % (ref 11.5–15.5)
WBC: 5.8 10*3/uL (ref 4.0–10.5)
nRBC: 0 % (ref 0.0–0.2)

## 2023-09-24 LAB — COMPREHENSIVE METABOLIC PANEL
ALT: 49 U/L — ABNORMAL HIGH (ref 0–44)
AST: 29 U/L (ref 15–41)
Albumin: 3.6 g/dL (ref 3.5–5.0)
Alkaline Phosphatase: 42 U/L (ref 38–126)
Anion gap: 6 (ref 5–15)
BUN: 6 mg/dL (ref 6–20)
CO2: 23 mmol/L (ref 22–32)
Calcium: 9 mg/dL (ref 8.9–10.3)
Chloride: 109 mmol/L (ref 98–111)
Creatinine, Ser: 0.74 mg/dL (ref 0.44–1.00)
GFR, Estimated: >60 mL/min (ref >60)
Glucose, Bld: 103 mg/dL — ABNORMAL HIGH (ref 70–99)
Potassium: 3.8 mmol/L (ref 3.5–5.1)
Sodium: 138 mmol/L (ref 135–145)
Total Bilirubin: 0.5 mg/dL (ref 0.0–1.2)
Total Protein: 6.7 g/dL (ref 6.5–8.1)

## 2023-09-24 LAB — URINALYSIS, ROUTINE W REFLEX MICROSCOPIC
Bilirubin Urine: NEGATIVE
Glucose, UA: NEGATIVE mg/dL
Hgb urine dipstick: NEGATIVE
Ketones, ur: NEGATIVE mg/dL
Leukocytes,Ua: NEGATIVE
Nitrite: NEGATIVE
Protein, ur: NEGATIVE mg/dL
Specific Gravity, Urine: 1.018 (ref 1.005–1.030)
pH: 6 (ref 5.0–8.0)

## 2023-09-24 LAB — HCG, SERUM, QUALITATIVE: Preg, Serum: NEGATIVE

## 2023-09-24 LAB — LIPASE, BLOOD: Lipase: 27 U/L (ref 11–51)

## 2023-09-24 MED ORDER — PANTOPRAZOLE SODIUM 20 MG PO TBEC: 20.0000 mg | DELAYED_RELEASE_TABLET | Freq: Every day | ORAL | 0 refills | Status: DC

## 2023-09-24 NOTE — Discharge Instructions (Addendum)
 You are seen today for abdominal pain. Your workup for gallbladder pathology was negative. However your history is most consistent with acute gastritis versus peptic ulcer disease. I recommend you start Protonix 20 mg daily for the next 2 weeks and follow-up with a gastroenterologist given your family history of liver disease. In the meanwhile the Protonix starts to kick in, you may start using Maalox/Mylanta or Pepcid daily I recommend at least twice daily until the Protonix kicks in in 2 to 3 days.  Follow-up with your normal primary care doctor in the interim.

## 2023-09-24 NOTE — ED Provider Notes (Signed)
 Felicia Torres Provider Note   CSN: [id number] Arrival date & time: 09/24/23  0865     History Chief Complaint  Patient presents with   Abdominal Pain   Nausea    HPI Felicia Torres is a 38 y.o. female presenting for chief complaint of abdominal pain and nausea. States that she has been having right upper quadrant pain over the last 4 to 5 days.  Episodic heartburn.  Told her to history of hiatal hernia in the past.  Denies fevers chills nausea vomiting shortness of breath otherwise. Ambulatory tolerating p.o. intake on arrival.  Symptoms worse with spicy foods..   Patient's recorded medical, surgical, social, medication list and allergies were reviewed in the Snapshot window as part of the initial history.   Review of Systems   Review of Systems  Constitutional:  Negative for chills and fever.  HENT:  Negative for ear pain and sore throat.   Eyes:  Negative for pain and visual disturbance.  Respiratory:  Negative for cough and shortness of breath.   Cardiovascular:  Negative for chest pain and palpitations.  Gastrointestinal:  Negative for abdominal pain and vomiting.  Genitourinary:  Negative for dysuria and hematuria.  Musculoskeletal:  Negative for arthralgias and back pain.  Skin:  Negative for color change and rash.  Neurological:  Negative for seizures and syncope.  All other systems reviewed and are negative.   Physical Exam Updated Vital Signs BP (!) 138/100 (BP Location: Right Arm)   Pulse 83   Temp 98.5 F (36.9 C)   Resp 20   Ht 5\' 4"  (1.626 m)   Wt 79.4 kg   LMP 09/10/2023   SpO2 100%   BMI 30.04 kg/m  Physical Exam Vitals and nursing note reviewed.  Constitutional:      General: She is not in acute distress.    Appearance: She is well-developed.  HENT:     Head: Normocephalic and atraumatic.  Eyes:     Conjunctiva/sclera: Conjunctivae normal.  Cardiovascular:     Rate and Rhythm: Normal rate and  regular rhythm.     Heart sounds: No murmur heard. Pulmonary:     Effort: Pulmonary effort is normal. No respiratory distress.     Breath sounds: Normal breath sounds.  Abdominal:     General: There is no distension.     Palpations: Abdomen is soft.     Tenderness: There is no abdominal tenderness. There is no right CVA tenderness or left CVA tenderness.  Musculoskeletal:        General: No swelling or tenderness. Normal range of motion.     Cervical back: Neck supple.  Skin:    General: Skin is warm and dry.  Neurological:     General: No focal deficit present.     Mental Status: She is alert and oriented to person, place, and time. Mental status is at baseline.     Cranial Nerves: No cranial nerve deficit.      ED Course/ Medical Decision Making/ A&P    Procedures Procedures   Medications Ordered in ED Medications - No data to display Medical Decision Making:   [name] is a 38 y.o. female who presented to the ED today with abdominal pain, detailed above.    Additional history discussed with patient's family/caregivers.  Patient placed on continuous vitals and telemetry monitoring while in ED which was reviewed periodically.  Complete initial physical exam performed, notably the patient  was HDS  in NAd.     Reviewed and confirmed nursing documentation for past medical history, family history, social history.    Initial Assessment:   With the patient's presentation of abdominal pain, most likely diagnosis is nonspecific etiology versus gastritis. Other diagnoses were considered including (but not limited to) gastroenteritis, colitis, small bowel obstruction, appendicitis, cholecystitis, pancreatitis, nephrolithiasis, UTI, pyleonephritis, ruptured ectopic pregnancy, PID, ovarian torsion. These are considered less likely due to history of present illness and physical exam findings.   This is most consistent with an acute life/limb threatening illness complicated by  underlying chronic conditions.   Initial Plan:  CBC/CMP to evaluate for underlying infectious/metabolic etiology for patient's abdominal pain  Lipase to evaluate for pancreatitis  EKG to evaluate for cardiac source of pain  Right upper quadrant ultrasound to identify for any localizing etiology of patient's symptoms Urinalysis and repeat physical assessment to evaluate for UTI/Pyelonpehritis  Empiric management of symptoms with escalating pain control and antiemetics as needed.   Initial Study Results:   Laboratory  All laboratory results reviewed without evidence of clinically relevant pathology.      EKG EKG was reviewed independently. Rate, rhythm, axis, intervals all examined and without medically relevant abnormality. ST segments without concerns for elevations.    Radiology All images reviewed independently. Agree with radiology report at this time.   US Abdomen Limited RUQ (LIVER/GB) Result Date: 09/24/2023 CLINICAL DATA:  Right upper quadrant pain EXAM: ULTRASOUND ABDOMEN LIMITED RIGHT UPPER QUADRANT COMPARISON:  Renal stone CT 10/13/2017. FINDINGS: Gallbladder: Distended gallbladder. Borderline wall thickening of 3 mm. No adjacent fluid. No shadowing stones. No reported Murphy's sign. Common bile duct: Diameter: 4 mm Liver: Diffusely echogenic hepatic parenchyma consistent with fatty liver infiltration. With this level of echogenicity evaluation for underlying mass lesion is limited and if needed follow-up contrast CT or MRI as clinically appropriate. Portal vein is patent on color Doppler imaging with normal direction of blood flow towards the liver. Other: None. IMPRESSION: Fatty liver infiltration. No gallstones or ductal dilatation Electronically Signed   By: Karen Kays M.D.   On: 09/24/2023 11:40    Final Reassessment and Plan:   Patient's history of present illness physicals and findings and not consistent with any acute pathology.  No acute distress on serial reassessments.   Medication sent to pharmacy and patient referred to gastroenterology for outpatient care management given the family history of liver mass, slight ALT elevation and right upper quadrant symptoms.  She would likely warrant evaluation for peptic ulcer disease.  No evidence of biliary pathology at this time the patient stable for outpatient care management.  Disposition:  I have considered need for hospitalization, however, considering all of the above, I believe this patient is stable for discharge at this time.  Patient/family educated about specific return precautions for given chief complaint and symptoms.  Patient/family educated about follow-up with PCP.     Patient/family expressed understanding of return precautions and need for follow-up. Patient spoken to regarding all imaging and laboratory results and appropriate follow up for these results. All education provided in verbal form with additional information in written form. Time was allowed for answering of patient questions. Patient discharged.    Emergency Department Medication Summary:   Medications - No data to display    Clinical Impression:  1. Acute gastritis without hemorrhage, unspecified gastritis type   2. Abdominal pain, unspecified abdominal location      Discharge   Final Clinical Impression(s) / ED Diagnoses Final diagnoses:  Acute gastritis  without hemorrhage, unspecified gastritis type  Abdominal pain, unspecified abdominal location    Rx / DC Orders ED Discharge Orders          Ordered    pantoprazole (PROTONIX) 20 MG tablet  Daily        09/24/23 1300              Glyn Ade, MD 09/25/23 0840

## 2023-09-24 NOTE — ED Triage Notes (Signed)
 Pt. Stated, I started having upper right abdominal pain, started Monday and Ive also have had some nausea. Its really not a pain but irritation.

## 2023-09-24 NOTE — ED Notes (Signed)
 Patient transported to Ultrasound

## 2023-09-24 NOTE — ED Notes (Signed)
 Pt discharged by EDP without a set of vitals

## 2023-11-11 ENCOUNTER — Encounter: Payer: Self-pay | Admitting: Physician Assistant

## 2023-11-11 ENCOUNTER — Other Ambulatory Visit (INDEPENDENT_AMBULATORY_CARE_PROVIDER_SITE_OTHER)

## 2023-11-11 ENCOUNTER — Ambulatory Visit: Admitting: Physician Assistant

## 2023-11-11 VITALS — BP 118/78 | HR 72 | Ht 64.0 in | Wt 176.4 lb

## 2023-11-11 DIAGNOSIS — R7989 Other specified abnormal findings of blood chemistry: Secondary | ICD-10-CM

## 2023-11-11 DIAGNOSIS — R110 Nausea: Secondary | ICD-10-CM

## 2023-11-11 DIAGNOSIS — K760 Fatty (change of) liver, not elsewhere classified: Secondary | ICD-10-CM | POA: Diagnosis not present

## 2023-11-11 DIAGNOSIS — R1011 Right upper quadrant pain: Secondary | ICD-10-CM

## 2023-11-11 DIAGNOSIS — K219 Gastro-esophageal reflux disease without esophagitis: Secondary | ICD-10-CM

## 2023-11-11 DIAGNOSIS — R1013 Epigastric pain: Secondary | ICD-10-CM

## 2023-11-11 LAB — HEPATIC FUNCTION PANEL
ALT: 43 U/L — ABNORMAL HIGH (ref 0–35)
AST: 24 U/L (ref 0–37)
Albumin: 4.1 g/dL (ref 3.5–5.2)
Alkaline Phosphatase: 48 U/L (ref 39–117)
Bilirubin, Direct: 0.1 mg/dL (ref 0.0–0.3)
Total Bilirubin: 0.4 mg/dL (ref 0.2–1.2)
Total Protein: 7.1 g/dL (ref 6.0–8.3)

## 2023-11-11 MED ORDER — PANTOPRAZOLE SODIUM 40 MG PO TBEC: 40.0000 mg | DELAYED_RELEASE_TABLET | Freq: Every day | ORAL | 3 refills | Status: AC

## 2023-11-11 NOTE — Patient Instructions (Addendum)
 Your provider has requested that you go to the basement level for lab work before leaving today. Press "B" on the elevator. The lab is located at the first door on the left as you exit the elevator.  We have sent the following medications to your pharmacy for you to pick up at your convenience: Pantoprazole  40 mg daily.  You have been scheduled for an MRI at [facility] on 11/13/23. Your appointment time is 1pm. Please arrive to admitting (at main entrance of the hospital) 30 minutes prior to your appointment time for registration purposes. Please make certain not to have anything to eat or drink 4 hours prior to your test. In addition, if you have any metal in your body, have a pacemaker or defibrillator, please be sure to let your ordering physician know. This test typically takes 45 minutes to 1 hour to complete. Should you need to reschedule, please call [phone] to do so.   Your provider has ordered "Diatherix" stool testing for you. You have received a kit from our office today containing all necessary supplies to complete this test. Please carefully read the stool collection instructions provided in the kit before opening the accompanying materials. In addition, be sure there is a label providing your full name and date of birth on the "puritan opti-swab" tube that is supplied in the kit (if you do not see a label with this information on your test tube, please make us  aware before test collection!). After completing the test, you should secure the purtian tube into the specimen biohazard bag. The [facility] E-Req sheet (including date and time of specimen collection) should be placed into the outside pocket of the specimen biohazard bag and returned to the [place] lab (basement floor of [facility]) within 3 days of collection. Please make sure to give the specimen to a staff member at the lab. DO NOT leave the specimen on the counter.   If the specimen date and  time (can be found in the upper right boxed portion of the sheet) are not filled out on the E-Req sheet, the test will NOT be performed.   _______________________________________________________  If your blood pressure at your visit was 140/90 or greater, please contact your primary care physician to follow up on this.  _______________________________________________________  If you are age 89 or older, your body mass index should be between 23-30. Your Body mass index is 30.27 kg/m. If this is out of the aforementioned range listed, please consider follow up with your Primary Care Provider.  If you are age 35 or younger, your body mass index should be between 19-25. Your Body mass index is 30.27 kg/m. If this is out of the aformentioned range listed, please consider follow up with your Primary Care Provider.   ________________________________________________________  The [facility] providers would like to encourage you to use MYCHART to communicate with providers for non-urgent requests or questions.  Due to long hold times on the telephone, sending your provider a message by [facility] may be a faster and more efficient way to get a response.  Please allow 48 business hours for a response.  Please remember that this is for non-urgent requests.  _______________________________________________________   It was a pleasure to see you today!  Thank you for trusting me with your gastrointestinal care!

## 2023-11-11 NOTE — Progress Notes (Signed)
 Agree with assessment and plan as outlined.

## 2023-11-11 NOTE — Progress Notes (Signed)
 Brigitte Canard, PA-C [address] [place], [place]  [address] Phone: [phone]   Gastroenterology Consultation  Referring Provider:     Dianah Fort, [place] Primary Care Physician:  Dianah Fort, [place] Primary Gastroenterologist:  Brigitte Canard, PA-C / Alvester Johnson, MD  Reason for Consultation:     ED follow-up Upper abdominal pain        HPI:   Felicia Torres is a 38 y.o. y/o female referred for consultation & management  by Dianah Fort, PA.  This is a new patient to our practice.  Here for ED follow-up of gastritis.  No previous EGD, colonoscopy, or GI evaluation.  Patient went to [facility] ED 09/24/2023 to evaluate acute RUQ pain with nausea x 4 to 5 days.  Also had episodes of heartburn.  Was told she had hiatal hernia in the past.  No vomiting.  Symptoms worse after eating spicy foods.  She was prescribed pantoprazole  20 Mg once daily and told to follow-up with GI.  She took pantoprazole  for a few days and discontinued because it was not helping.  She has changed her diet in the past month.  Is avoiding GERD trigger foods.  Upper GI symptoms have improved in the past month with dietary changes.  She still continues to have occasional episodes of RUQ pain which is worse after eating and worse with movement.  She has diffuse upper abdominal bloating which comes and goes.  Occasional episode of nausea, but no vomiting.  Denies lower GI symptoms such as melena or hematochezia.  09/24/2023 labs: Negative hCG, negative UA, normal CBC, CMP, and lipase.  WBC 5.8, hemoglobin 13.0.  Slightly elevated ALT 49, all other LFTs normal.  09/24/2023 RUQ ultrasound: Fatty liver.  No gallstones or duct dilatation.  Patient's sister had liver tumors / fatty liver and died from complications of liver surgery in 2017.  For this reason, patient has a lot of anxiety about fatty liver disease and RUQ pain.  She is here today with her mother who is also worried and tearful.  Past Medical  History:  Diagnosis Date   Family history of adverse reaction to anesthesia    History of kidney stones    Retinitis    Unsure about what eye     Past Surgical History:  Procedure Laterality Date   CYSTOSCOPY WITH RETROGRADE PYELOGRAM, URETEROSCOPY AND STENT PLACEMENT Left 01/25/2020   Procedure: CYSTOSCOPY WITH LEFT RETROGRADE PYELOGRAM, URETEROSCOPY, STENT PLACEMENT;  Surgeon: Andrez Banker, MD;  Location: [facility];  Service: Urology;  Laterality: Left;   URETEROSCOPY Right 10/15/2017   Procedure: cystoscopy, right retrograde, right stent placement;  Surgeon: Andrez Banker, MD;  Location: [facility];  Service: Urology;  Laterality: Right;  right ureteroscopy , laser litho, right ureterala stent placement   WISDOM TOOTH EXTRACTION      Prior to Admission medications   Medication Sig Start Date End Date Taking? Authorizing Provider  ketorolac  (TORADOL ) 10 MG tablet Take 10 mg by mouth every 6 (six) hours as needed for moderate pain.  01/13/20   [provider]  pantoprazole  (PROTONIX ) 20 MG tablet Take 1 tablet (20 mg total) by mouth daily. 09/24/23   Onetha Bile, MD  PERCOCET 5-325 MG tablet Take 1 tablet by mouth every 6 (six) hours as needed. 01/17/20   [provider]  phenazopyridine  (PYRIDIUM ) 200 MG tablet Take 1 tablet (200 mg total) by mouth 3 (three) times daily as needed  for pain. 01/25/20   Andrez Banker, MD  promethazine  (PHENERGAN ) 12.5 MG tablet Take 12.5 mg by mouth every 6 (six) hours as needed. 01/17/20   [provider]  tamsulosin (FLOMAX) 0.4 MG CAPS capsule Take 0.4 mg by mouth daily. 01/16/20   [provider]    Family History  Problem Relation Age of Onset   Cancer Father        kidney cancer   Liver disease Sister      Social History   Tobacco Use   Smoking status: Former    Current packs/day: 1.00    Average packs/day: 1 pack/day for 10.0 years (10.0 ttl pk-yrs)    Types: Cigarettes    Smokeless tobacco: Never   Tobacco comments:    pt. said she stop few moths ago  Vaping Use   Vaping status: Never Used  Substance Use Topics   Alcohol use: Yes    Alcohol/week: 1.0 standard drink of alcohol    Types: 1 Standard drinks or equivalent per week    Comment: occas.   Drug use: Not Currently    Types: Hydrocodone     Allergies as of 11/11/2023 - Review Complete 11/11/2023  Allergen Reaction Noted   Blueberry flavoring agent (non-screening) Nausea And Vomiting and Rash 01/18/2020    Review of Systems:    All systems reviewed and negative except where noted in HPI.   Physical Exam:  BP 118/78   Pulse 72   Ht 5\' 4"  (1.626 m)   Wt 176 lb 6 oz (80 kg)   SpO2 99%   BMI 30.27 kg/m  No LMP recorded.  General:   Alert,  Well-developed, well-nourished, pleasant and cooperative in NAD Lungs:  Respirations even and unlabored.  Clear throughout to auscultation.   No wheezes, crackles, or rhonchi. No acute distress. Heart:  Regular rate and rhythm; no murmurs, clicks, rubs, or gallops. Abdomen:  Normal bowel sounds.  No bruits.  Soft, and non-distended without masses, hepatosplenomegaly or hernias noted.  Mild RUQ and Epigastric Tenderness.  No guarding or rebound tenderness.   No LUQ or Lower abdominal tenderness. Neurologic:  Alert and oriented x3;  grossly normal neurologically. Psych:  Alert and cooperative. Anxious, tearful mood and affect.  Imaging Studies: No results found.  Assessment and Plan:   Felicia Torres is a 38 y.o. y/o female returns for ED follow-up of upper abdominal pain with nausea.  RUQ ultrasound showed mild hepatic steatosis, otherwise normal.  No gallstones.  Labs showed 1 elevated LFT (ALT) likely due to fatty liver.  No anemia.  Suspect gastritis versus peptic ulcer. She has family history of sister who had liver disease.  1.  Acute upper abdominal pain (RUQ, Epig) with nausea -Check H. pylori stool test -ReStart Rx Pantoprazole  40mg  1 tablet  once daily, #30, 2 RF.  Give PPI more time to work (at least a month). -Continue Lifestyle Modifications to prevent Acid Reflux.  Rec. Avoid coffee, sodas, peppermint, garlic, onions, alcohol, citrus fruits, chocolate, tomatoes, fatty and spicey foods.  Avoid eating 2-3 hours before bedtime.   -If symptoms persist and spite of PPI, then consider EGD.  2.  Hepatic steatosis -Recommend a low-fat diet, regular exercise, and weight loss. -Patient education handout about fatty liver disease was given and discussed from up-to-date.   3.  Mild Elevated LFT (ALT) -Lab Hepatic Panal -Abdominal MRI / MRCP  4.  Family History of Sister with Liver disease (died in 23-Jan-2016 from complications of liver surgery).  Follow up 4 weeks with TG.    Brigitte Canard, PA-C

## 2023-11-13 ENCOUNTER — Other Ambulatory Visit (HOSPITAL_COMMUNITY): Payer: Self-pay | Admitting: Physician Assistant

## 2023-11-13 ENCOUNTER — Ambulatory Visit (HOSPITAL_COMMUNITY)
Admission: RE | Admit: 2023-11-13 | Discharge: 2023-11-13 | Disposition: A | Discharge status: Home / Self Care | Source: Ambulatory Visit | Attending: Physician Assistant | Admitting: Physician Assistant

## 2023-11-13 DIAGNOSIS — R1013 Epigastric pain: Secondary | ICD-10-CM | POA: Diagnosis present

## 2023-11-13 DIAGNOSIS — R52 Pain, unspecified: Secondary | ICD-10-CM

## 2023-11-13 DIAGNOSIS — K219 Gastro-esophageal reflux disease without esophagitis: Secondary | ICD-10-CM | POA: Diagnosis present

## 2023-11-13 DIAGNOSIS — R7989 Other specified abnormal findings of blood chemistry: Secondary | ICD-10-CM

## 2023-11-13 DIAGNOSIS — R1011 Right upper quadrant pain: Secondary | ICD-10-CM | POA: Diagnosis present

## 2023-11-13 DIAGNOSIS — K760 Fatty (change of) liver, not elsewhere classified: Secondary | ICD-10-CM | POA: Diagnosis present

## 2023-11-13 MED ORDER — GADOBUTROL 1 MMOL/ML IV SOLN
8.0000 mL | Freq: Once | INTRAVENOUS | Status: AC | PRN
  Administered 2023-11-13: 8 mL via INTRAVENOUS

## 2023-11-25 ENCOUNTER — Encounter: Payer: Self-pay | Admitting: Physician Assistant

## 2023-12-10 ENCOUNTER — Ambulatory Visit: Admitting: Physician Assistant

## 2023-12-10 ENCOUNTER — Encounter: Payer: Self-pay | Admitting: Physician Assistant

## 2023-12-10 VITALS — BP 112/74 | HR 77 | Ht 63.0 in | Wt 172.1 lb

## 2023-12-10 DIAGNOSIS — R0789 Other chest pain: Secondary | ICD-10-CM

## 2023-12-10 DIAGNOSIS — K760 Fatty (change of) liver, not elsewhere classified: Secondary | ICD-10-CM

## 2023-12-10 DIAGNOSIS — R7401 Elevation of levels of liver transaminase levels: Secondary | ICD-10-CM | POA: Diagnosis not present

## 2023-12-10 DIAGNOSIS — R1011 Right upper quadrant pain: Secondary | ICD-10-CM

## 2023-12-10 MED ORDER — MELOXICAM 15 MG PO TABS: ORAL_TABLET | ORAL | 0 refills | Status: AC

## 2023-12-10 NOTE — Progress Notes (Signed)
 Addendum: Reviewed and agree with assessment and management plan. Asha Grumbine, Carie Caddy, MD

## 2023-12-10 NOTE — Patient Instructions (Addendum)
 We have sent the following medications to your pharmacy for you to pick up at your convenience: Meloxicam 15 mg daily  Please follow up sooner if symptoms increase or worsen  Due to recent changes in healthcare laws, you may see the results of your imaging and laboratory studies on MyChart before your provider has had a chance to review them.  We understand that in some cases there may be results that are confusing or concerning to you. Not all laboratory results come back in the same time frame and the provider may be waiting for multiple results in order to interpret others.  Please give us  48 hours in order for your provider to thoroughly review all the results before contacting the office for clarification of your results.   _______________________________________________________  If your blood pressure at your visit was 140/90 or greater, please contact your primary care physician to follow up on this.  _______________________________________________________  If you are age 37 or older, your body mass index should be between 23-30. Your Body mass index is 30.49 kg/m. If this is out of the aforementioned range listed, please consider follow up with your Primary Care Provider.  If you are age 57 or younger, your body mass index should be between 19-25. Your Body mass index is 30.49 kg/m. If this is out of the aformentioned range listed, please consider follow up with your Primary Care Provider.   ________________________________________________________  The [place] [facility] providers would like to encourage you to use MYCHART to communicate with providers for non-urgent requests or questions.  Due to long hold times on the telephone, sending your provider a message by [facility] may be a faster and more efficient way to get a response.  Please allow 48 business hours for a response.  Please remember that this is for non-urgent requests.  _______________________________________________________ Thank  you for trusting me with your gastrointestinal care!   Brigitte Canard, PA

## 2023-12-10 NOTE — Progress Notes (Signed)
 Brigitte Canard, PA-C [address] [place], [place]  [address] Phone: [phone]   Primary Care Physician: Dianah Fort, [place]  Primary Gastroenterologist:  Brigitte Canard, PA-C / Dr. Laurell Pond   Chief Complaint:  F/U RUQ Pain and Fatty Liver       HPI:   Felicia Torres is a 38 y.o. female returns for 1 month follow-up of RUQ abdominal pain /right lateral side discomfort.  Also follow-up hepatic steatosis with mildly elevated ALT liver transaminase.  She was started on pantoprazole  40 Mg once daily 1 month ago with no benefit.    She continues to have a mild vague discomfort in the right lateral side lower rib cage area.  It is not affected by eating.  Pressure from her bra makes it uncomfortable.  It can be worse with movement.  Denies nausea, vomiting, diarrhea, constipation, melena, or hematochezia.  She is working hard to lose weight.  Denies any other GI symptoms.  11/11/23 Repeat Hepatic Panal: Improved Slight elevated ALT 43.  All other LFTs normal.  11/13/23 Diatherix H. pylori stool test negative.  11/16/23 Abdominal MRI/MRCP: 1. No MR findings of the abdomen to explain right upper quadrant pain. 2. Normal MRI appearance of the liver. No focal liver lesion or abnormal enhancement. No significant steatosis on this non quantitative examination. 3. No biliary ductal dilatation.  09/24/2023 labs: Negative hCG, negative UA, normal CBC, CMP, and lipase.  WBC 5.8, hemoglobin 13.0.  Slightly elevated ALT 49, all other LFTs normal.   09/24/2023 RUQ ultrasound: Fatty liver.  No gallstones or duct dilatation.   Patient's sister had liver tumors / fatty liver and died from complications of liver surgery in 2017.  For this reason, patient has a lot of anxiety about fatty liver disease and RUQ pain.     Current Outpatient Medications  Medication Sig Dispense Refill   meloxicam (MOBIC) 15 MG tablet Take once daily as needed daily with food for pain, do not take  aleve/ibuprofen with it, can take tylenol  30 tablet 0   pantoprazole  (PROTONIX ) 40 MG tablet Take 1 tablet (40 mg total) by mouth daily. 30 tablet 3   No current facility-administered medications for this visit.    Allergies as of 12/10/2023 - Review Complete 12/10/2023  Allergen Reaction Noted   Blueberry flavoring agent (non-screening) Nausea And Vomiting and Rash 01/18/2020    Past Medical History:  Diagnosis Date   Family history of adverse reaction to anesthesia    Fatty liver    History of kidney stones    Retinitis    Unsure about what eye     Past Surgical History:  Procedure Laterality Date   CYSTOSCOPY WITH RETROGRADE PYELOGRAM, URETEROSCOPY AND STENT PLACEMENT Left 01/25/2020   Procedure: CYSTOSCOPY WITH LEFT RETROGRADE PYELOGRAM, URETEROSCOPY, STENT PLACEMENT;  Surgeon: Andrez Banker, MD;  Location: [facility];  Service: Urology;  Laterality: Left;   URETEROSCOPY Right 10/15/2017   Procedure: cystoscopy, right retrograde, right stent placement;  Surgeon: Andrez Banker, MD;  Location: [facility];  Service: Urology;  Laterality: Right;  right ureteroscopy , laser litho, right ureterala stent placement   WISDOM TOOTH EXTRACTION      Review of Systems:    All systems reviewed and negative except where noted in HPI.    Physical Exam:  BP 112/74   Pulse 77   Ht 5\' 3"  (1.6 m)   Wt 172 lb 2 oz (78.1 kg)   LMP  12/04/2023 (Exact Date)   SpO2 95%   BMI 30.49 kg/m  Patient's last menstrual period was 12/04/2023 (exact date).  General: Well-nourished, well-developed in no acute distress.  Lungs: Clear to auscultation bilaterally. Non-labored. Heart: Regular rate and rhythm, no murmurs rubs or gallops.  Abdomen: Bowel sounds are normal; Abdomen is Soft; No hepatosplenomegaly, masses or hernias;  No Abdominal Tenderness; No guarding or rebound tenderness. Neuro: Alert and oriented x 3.  Grossly intact.  Psych: Alert and cooperative, normal mood and  affect.   Imaging Studies: MR ABDOMEN MRCP W WO CONTAST Result Date: 11/16/2023 CLINICAL DATA:  Right upper quadrant abdominal pain, elevated LFTs, hepatic steatosis EXAM: MRI ABDOMEN WITHOUT AND WITH CONTRAST (INCLUDING MRCP) TECHNIQUE: Multiplanar multisequence MR imaging of the abdomen was performed both before and after the administration of intravenous contrast. Heavily T2-weighted images of the biliary and pancreatic ducts were obtained, and three-dimensional MRCP images were rendered by post processing. CONTRAST:  8mL GADAVIST  GADOBUTROL  1 MMOL/ML IV SOLN COMPARISON:  None Available. FINDINGS: Lower chest: No acute abnormality. Hepatobiliary: No solid liver abnormality is seen. No gallstones, gallbladder wall thickening, or biliary dilatation. Pancreas: Unremarkable. No pancreatic ductal dilatation or surrounding inflammatory changes. Spleen: Normal in size without significant abnormality. Adrenals/Urinary Tract: Adrenal glands are unremarkable. Kidneys are normal, without renal calculi, solid lesion, or hydronephrosis. Stomach/Bowel: Stomach is within normal limits. No evidence of bowel wall thickening, distention, or inflammatory changes. Vascular/Lymphatic: No significant vascular findings are present. No enlarged abdominal lymph nodes. Other: No abdominal wall hernia or abnormality. No ascites. Musculoskeletal: No acute or significant osseous findings. IMPRESSION: 1. No MR findings of the abdomen to explain right upper quadrant pain. 2. Normal MRI appearance of the liver. No focal liver lesion or abnormal enhancement. No significant steatosis on this non quantitative examination. 3. No biliary ductal dilatation. Electronically Signed   By: Fredricka Jenny M.D.   On: 11/16/2023 16:49   MR 3D Recon At Scanner Result Date: 11/16/2023 CLINICAL DATA:  Right upper quadrant abdominal pain, elevated LFTs, hepatic steatosis EXAM: MRI ABDOMEN WITHOUT AND WITH CONTRAST (INCLUDING MRCP) TECHNIQUE: Multiplanar  multisequence MR imaging of the abdomen was performed both before and after the administration of intravenous contrast. Heavily T2-weighted images of the biliary and pancreatic ducts were obtained, and three-dimensional MRCP images were rendered by post processing. CONTRAST:  8mL GADAVIST  GADOBUTROL  1 MMOL/ML IV SOLN COMPARISON:  None Available. FINDINGS: Lower chest: No acute abnormality. Hepatobiliary: No solid liver abnormality is seen. No gallstones, gallbladder wall thickening, or biliary dilatation. Pancreas: Unremarkable. No pancreatic ductal dilatation or surrounding inflammatory changes. Spleen: Normal in size without significant abnormality. Adrenals/Urinary Tract: Adrenal glands are unremarkable. Kidneys are normal, without renal calculi, solid lesion, or hydronephrosis. Stomach/Bowel: Stomach is within normal limits. No evidence of bowel wall thickening, distention, or inflammatory changes. Vascular/Lymphatic: No significant vascular findings are present. No enlarged abdominal lymph nodes. Other: No abdominal wall hernia or abnormality. No ascites. Musculoskeletal: No acute or significant osseous findings. IMPRESSION: 1. No MR findings of the abdomen to explain right upper quadrant pain. 2. Normal MRI appearance of the liver. No focal liver lesion or abnormal enhancement. No significant steatosis on this non quantitative examination. 3. No biliary ductal dilatation. Electronically Signed   By: Fredricka Jenny M.D.   On: 11/16/2023 16:49    Labs: CBC    Component Value Date/Time   WBC 5.8 09/24/2023 0959   RBC 4.49 09/24/2023 0959   HGB 13.0 09/24/2023 0959   HCT 38.7 09/24/2023 0959  PLT 327 09/24/2023 0959   MCV 86.2 09/24/2023 0959   MCH 29.0 09/24/2023 0959   MCHC 33.6 09/24/2023 0959   RDW 13.3 09/24/2023 0959   LYMPHSABS 1.3 06/27/2007 0105   MONOABS 0.6 06/27/2007 0105   EOSABS 0.0 (L) 06/27/2007 0105   BASOSABS 0.0 06/27/2007 0105    CMP     Component Value Date/Time   NA  138 09/24/2023 0959   K 3.8 09/24/2023 0959   CL 109 09/24/2023 0959   CO2 23 09/24/2023 0959   GLUCOSE 103 (H) 09/24/2023 0959   BUN 6 09/24/2023 0959   CREATININE 0.74 09/24/2023 0959   CALCIUM 9.0 09/24/2023 0959   PROT 7.1 11/11/2023 1518   ALBUMIN 4.1 11/11/2023 1518   AST 24 11/11/2023 1518   ALT 43 (H) 11/11/2023 1518   ALKPHOS 48 11/11/2023 1518   BILITOT 0.4 11/11/2023 1518   GFRNONAA >60 09/24/2023 0959   GFRAA 55 (L) 01/19/2020 1129       Assessment and Plan:   ANNASOPHIA CROCKER is a 38 y.o. y/o female presents for 1 month follow-up of RUQ and right lateral side pain.  Symptoms most consistent with musculoskeletal pain.  Recent abdominal MRI unrevealing.  She has mild hepatic steatosis with no fibrosis.  1.  RUQ/right lateral side discomfort.  Consistent with musculoskeletal pain. - Stop pantoprazole  which did not help. - Try Rx meloxicam 15 Mg once daily for 30 days, no refills. - Avoid heavy lifting.  2.  Hepatic steatosis with mildly elevated ALT liver transaminase.  Normal abdominal MRI/MRCP. - Reassurance - Recommend low-fat diet, exercise, weight loss to treat fatty liver disease.  Limit alcohol use.  She rarely drinks alcohol. - Discussed risk differential  Fibrosis 4 Score = .4 (Low risk)        Interpretation for patients with NAFLD          <1.30       -  F0-F1 (Low risk)          1.30-2.67 -  Indeterminate           >2.67      -  F3-F4 (High risk)     Validated for ages 38-65  Brigitte Canard, [place]  Follow up if symptoms worsen or persist.
